# Patient Record
Sex: Female | Born: 1970 | Race: White | Hispanic: No | Marital: Single | Smoking: Current some day smoker
Health system: Southern US, Community
[De-identification: ages and names within clinical notes are randomized; demographics above are authoritative.]

## PROBLEM LIST (undated history)

## (undated) DIAGNOSIS — L409 Psoriasis, unspecified: Secondary | ICD-10-CM

## (undated) DIAGNOSIS — R809 Proteinuria, unspecified: Secondary | ICD-10-CM

## (undated) DIAGNOSIS — C50411 Malignant neoplasm of upper-outer quadrant of right female breast: Secondary | ICD-10-CM

## (undated) DIAGNOSIS — J45909 Unspecified asthma, uncomplicated: Secondary | ICD-10-CM

## (undated) DIAGNOSIS — E1129 Type 2 diabetes mellitus with other diabetic kidney complication: Secondary | ICD-10-CM

## (undated) DIAGNOSIS — C50919 Malignant neoplasm of unspecified site of unspecified female breast: Secondary | ICD-10-CM

## (undated) DIAGNOSIS — E039 Hypothyroidism, unspecified: Secondary | ICD-10-CM

## (undated) DIAGNOSIS — R011 Cardiac murmur, unspecified: Secondary | ICD-10-CM

## (undated) DIAGNOSIS — Z170 Estrogen receptor positive status [ER+]: Secondary | ICD-10-CM

## (undated) DIAGNOSIS — E781 Pure hyperglyceridemia: Secondary | ICD-10-CM

## (undated) DIAGNOSIS — M1990 Unspecified osteoarthritis, unspecified site: Secondary | ICD-10-CM

## (undated) DIAGNOSIS — I10 Essential (primary) hypertension: Secondary | ICD-10-CM

## (undated) HISTORY — DX: Malignant neoplasm of upper-outer quadrant of right female breast: C50.411

## (undated) HISTORY — PX: BREAST BIOPSY: SHX20

## (undated) HISTORY — DX: Essential (primary) hypertension: I10

## (undated) HISTORY — DX: Estrogen receptor positive status (ER+): Z17.0

## (undated) HISTORY — DX: Proteinuria, unspecified: R80.9

## (undated) HISTORY — DX: Pure hyperglyceridemia: E78.1

## (undated) HISTORY — PX: LAPAROSCOPIC CHOLECYSTECTOMY: SUR755

## (undated) HISTORY — PX: OTHER SURGICAL HISTORY: SHX169

## (undated) HISTORY — DX: Type 2 diabetes mellitus with other diabetic kidney complication: E11.29

## (undated) HISTORY — DX: Psoriasis, unspecified: L40.9

## (undated) HISTORY — DX: Hypothyroidism, unspecified: E03.9

## (undated) HISTORY — PX: WISDOM TOOTH EXTRACTION: SHX21

---

## 2008-08-01 ENCOUNTER — Ambulatory Visit: Payer: Self-pay

## 2013-07-31 ENCOUNTER — Ambulatory Visit: Payer: Self-pay | Admitting: Internal Medicine

## 2014-11-26 DIAGNOSIS — E039 Hypothyroidism, unspecified: Secondary | ICD-10-CM | POA: Insufficient documentation

## 2015-03-06 DIAGNOSIS — I10 Essential (primary) hypertension: Secondary | ICD-10-CM | POA: Insufficient documentation

## 2015-07-25 ENCOUNTER — Other Ambulatory Visit: Payer: Self-pay | Admitting: Obstetrics and Gynecology

## 2015-07-25 DIAGNOSIS — E1129 Type 2 diabetes mellitus with other diabetic kidney complication: Secondary | ICD-10-CM | POA: Insufficient documentation

## 2015-07-25 DIAGNOSIS — E781 Pure hyperglyceridemia: Secondary | ICD-10-CM | POA: Insufficient documentation

## 2015-07-25 DIAGNOSIS — Z1231 Encounter for screening mammogram for malignant neoplasm of breast: Secondary | ICD-10-CM

## 2015-08-01 ENCOUNTER — Ambulatory Visit: Payer: Self-pay

## 2015-08-02 ENCOUNTER — Ambulatory Visit
Admission: RE | Admit: 2015-08-02 | Discharge: 2015-08-02 | Disposition: A | Discharge status: Home / Self Care | Payer: BLUE CROSS/BLUE SHIELD | Source: Ambulatory Visit | Attending: Obstetrics and Gynecology | Admitting: Obstetrics and Gynecology

## 2015-08-02 DIAGNOSIS — Z1231 Encounter for screening mammogram for malignant neoplasm of breast: Secondary | ICD-10-CM | POA: Diagnosis not present

## 2015-11-14 DIAGNOSIS — Z6841 Body Mass Index (BMI) 40.0 and over, adult: Secondary | ICD-10-CM | POA: Insufficient documentation

## 2016-07-07 ENCOUNTER — Other Ambulatory Visit: Payer: Self-pay | Admitting: Obstetrics and Gynecology

## 2016-07-07 DIAGNOSIS — Z1231 Encounter for screening mammogram for malignant neoplasm of breast: Secondary | ICD-10-CM

## 2016-08-05 ENCOUNTER — Ambulatory Visit
Admission: RE | Admit: 2016-08-05 | Discharge: 2016-08-05 | Disposition: A | Discharge status: Home / Self Care | Payer: Managed Care, Other (non HMO) | Source: Ambulatory Visit | Attending: Obstetrics and Gynecology | Admitting: Obstetrics and Gynecology

## 2016-08-05 DIAGNOSIS — R928 Other abnormal and inconclusive findings on diagnostic imaging of breast: Secondary | ICD-10-CM | POA: Insufficient documentation

## 2016-08-05 DIAGNOSIS — Z1231 Encounter for screening mammogram for malignant neoplasm of breast: Secondary | ICD-10-CM | POA: Insufficient documentation

## 2016-08-13 ENCOUNTER — Other Ambulatory Visit: Payer: Self-pay | Admitting: Obstetrics and Gynecology

## 2016-08-13 DIAGNOSIS — R921 Mammographic calcification found on diagnostic imaging of breast: Secondary | ICD-10-CM

## 2016-08-13 DIAGNOSIS — R928 Other abnormal and inconclusive findings on diagnostic imaging of breast: Secondary | ICD-10-CM

## 2016-08-21 ENCOUNTER — Ambulatory Visit
Admission: RE | Admit: 2016-08-21 | Discharge: 2016-08-21 | Disposition: A | Discharge status: Home / Self Care | Payer: Managed Care, Other (non HMO) | Source: Ambulatory Visit | Attending: Obstetrics and Gynecology | Admitting: Obstetrics and Gynecology

## 2016-08-21 DIAGNOSIS — R928 Other abnormal and inconclusive findings on diagnostic imaging of breast: Secondary | ICD-10-CM

## 2016-08-21 DIAGNOSIS — R921 Mammographic calcification found on diagnostic imaging of breast: Secondary | ICD-10-CM

## 2016-08-24 ENCOUNTER — Other Ambulatory Visit: Payer: Self-pay | Admitting: Obstetrics and Gynecology

## 2016-08-24 DIAGNOSIS — R928 Other abnormal and inconclusive findings on diagnostic imaging of breast: Secondary | ICD-10-CM

## 2016-08-24 DIAGNOSIS — R921 Mammographic calcification found on diagnostic imaging of breast: Secondary | ICD-10-CM

## 2016-08-25 ENCOUNTER — Other Ambulatory Visit: Payer: BLUE CROSS/BLUE SHIELD

## 2016-08-25 ENCOUNTER — Ambulatory Visit: Payer: BLUE CROSS/BLUE SHIELD

## 2016-09-08 ENCOUNTER — Ambulatory Visit
Admission: RE | Admit: 2016-09-08 | Discharge: 2016-09-08 | Disposition: A | Discharge status: Home / Self Care | Payer: Managed Care, Other (non HMO) | Source: Ambulatory Visit | Attending: Obstetrics and Gynecology | Admitting: Obstetrics and Gynecology

## 2016-09-08 DIAGNOSIS — R921 Mammographic calcification found on diagnostic imaging of breast: Secondary | ICD-10-CM

## 2016-09-08 DIAGNOSIS — R928 Other abnormal and inconclusive findings on diagnostic imaging of breast: Secondary | ICD-10-CM

## 2016-09-08 HISTORY — PX: BREAST BIOPSY: SHX20

## 2016-09-18 ENCOUNTER — Other Ambulatory Visit: Payer: Self-pay | Admitting: Obstetrics and Gynecology

## 2016-09-18 DIAGNOSIS — R921 Mammographic calcification found on diagnostic imaging of breast: Secondary | ICD-10-CM

## 2017-02-26 ENCOUNTER — Ambulatory Visit: Payer: BLUE CROSS/BLUE SHIELD

## 2017-03-03 ENCOUNTER — Ambulatory Visit
Admission: RE | Admit: 2017-03-03 | Discharge: 2017-03-03 | Disposition: A | Discharge status: Home / Self Care | Payer: Managed Care, Other (non HMO) | Source: Ambulatory Visit | Attending: Obstetrics and Gynecology | Admitting: Obstetrics and Gynecology

## 2017-03-03 DIAGNOSIS — R921 Mammographic calcification found on diagnostic imaging of breast: Secondary | ICD-10-CM | POA: Diagnosis present

## 2017-06-02 DIAGNOSIS — R3129 Other microscopic hematuria: Secondary | ICD-10-CM | POA: Insufficient documentation

## 2018-07-25 ENCOUNTER — Other Ambulatory Visit: Payer: Self-pay | Admitting: Obstetrics and Gynecology

## 2018-07-25 DIAGNOSIS — Z1231 Encounter for screening mammogram for malignant neoplasm of breast: Secondary | ICD-10-CM

## 2018-08-19 ENCOUNTER — Ambulatory Visit
Admission: RE | Admit: 2018-08-19 | Discharge: 2018-08-19 | Disposition: A | Discharge status: Home / Self Care | Payer: Managed Care, Other (non HMO) | Source: Ambulatory Visit | Attending: Obstetrics and Gynecology | Admitting: Obstetrics and Gynecology

## 2018-08-19 DIAGNOSIS — Z1231 Encounter for screening mammogram for malignant neoplasm of breast: Secondary | ICD-10-CM | POA: Diagnosis present

## 2019-08-15 ENCOUNTER — Other Ambulatory Visit: Payer: Self-pay | Admitting: Obstetrics and Gynecology

## 2019-08-15 DIAGNOSIS — Z1231 Encounter for screening mammogram for malignant neoplasm of breast: Secondary | ICD-10-CM

## 2019-08-15 HISTORY — PX: BREAST BIOPSY: SHX20

## 2019-08-22 ENCOUNTER — Ambulatory Visit
Admission: RE | Admit: 2019-08-22 | Discharge: 2019-08-22 | Disposition: A | Discharge status: Home / Self Care | Payer: Managed Care, Other (non HMO) | Source: Ambulatory Visit | Attending: Obstetrics and Gynecology | Admitting: Obstetrics and Gynecology

## 2019-08-22 DIAGNOSIS — Z1231 Encounter for screening mammogram for malignant neoplasm of breast: Secondary | ICD-10-CM | POA: Diagnosis present

## 2019-08-24 ENCOUNTER — Other Ambulatory Visit: Payer: Self-pay | Admitting: Obstetrics and Gynecology

## 2019-08-24 DIAGNOSIS — N6310 Unspecified lump in the right breast, unspecified quadrant: Secondary | ICD-10-CM

## 2019-08-24 DIAGNOSIS — R928 Other abnormal and inconclusive findings on diagnostic imaging of breast: Secondary | ICD-10-CM

## 2019-08-24 DIAGNOSIS — R921 Mammographic calcification found on diagnostic imaging of breast: Secondary | ICD-10-CM

## 2019-09-04 ENCOUNTER — Ambulatory Visit
Admission: RE | Admit: 2019-09-04 | Discharge: 2019-09-04 | Disposition: A | Discharge status: Home / Self Care | Payer: Managed Care, Other (non HMO) | Source: Ambulatory Visit | Attending: Obstetrics and Gynecology | Admitting: Obstetrics and Gynecology

## 2019-09-04 DIAGNOSIS — N6310 Unspecified lump in the right breast, unspecified quadrant: Secondary | ICD-10-CM

## 2019-09-04 DIAGNOSIS — R928 Other abnormal and inconclusive findings on diagnostic imaging of breast: Secondary | ICD-10-CM

## 2019-09-04 DIAGNOSIS — R921 Mammographic calcification found on diagnostic imaging of breast: Secondary | ICD-10-CM

## 2019-09-11 ENCOUNTER — Other Ambulatory Visit: Payer: Self-pay | Admitting: Obstetrics and Gynecology

## 2019-09-11 DIAGNOSIS — R928 Other abnormal and inconclusive findings on diagnostic imaging of breast: Secondary | ICD-10-CM

## 2019-09-11 DIAGNOSIS — N6310 Unspecified lump in the right breast, unspecified quadrant: Secondary | ICD-10-CM

## 2019-09-14 ENCOUNTER — Ambulatory Visit
Admission: RE | Admit: 2019-09-14 | Discharge: 2019-09-14 | Disposition: A | Discharge status: Home / Self Care | Payer: Managed Care, Other (non HMO) | Source: Ambulatory Visit | Attending: Obstetrics and Gynecology | Admitting: Obstetrics and Gynecology

## 2019-09-14 ENCOUNTER — Other Ambulatory Visit: Payer: Self-pay

## 2019-09-14 DIAGNOSIS — R928 Other abnormal and inconclusive findings on diagnostic imaging of breast: Secondary | ICD-10-CM | POA: Diagnosis present

## 2019-09-14 DIAGNOSIS — N6310 Unspecified lump in the right breast, unspecified quadrant: Secondary | ICD-10-CM

## 2019-09-18 ENCOUNTER — Other Ambulatory Visit: Payer: Self-pay | Admitting: Anatomic Pathology & Clinical Pathology

## 2019-09-18 ENCOUNTER — Encounter: Payer: Self-pay | Admitting: *Deleted

## 2019-09-18 DIAGNOSIS — C50911 Malignant neoplasm of unspecified site of right female breast: Secondary | ICD-10-CM

## 2019-09-18 DIAGNOSIS — C50912 Malignant neoplasm of unspecified site of left female breast: Secondary | ICD-10-CM

## 2019-09-18 NOTE — Progress Notes (Signed)
Called patient today to establish navigation services.  She is newly diagnosed with invasive mammary carcinoma with lobular features.  States she would like to see Dr. Bary Castilla for surgical consult and Dr. Tish Men for medical oncology consult.  Patient is scheduled to see Dr. Bary Castilla on 09/21/19 at 445.  Will take educational literature and medical oncology appointment to patient tomorrow.  She works at [employer].

## 2019-09-19 ENCOUNTER — Encounter: Payer: Self-pay | Admitting: *Deleted

## 2019-09-19 ENCOUNTER — Other Ambulatory Visit: Payer: Self-pay | Admitting: *Deleted

## 2019-09-19 NOTE — Progress Notes (Signed)
Met patient in person today.  Gave patient breast cancer educational literature, "My Breast Cancer Treatment Handbook" by Josephine Igo, RN.  Informed patient I will be working remotely on Friday, but will call her after her consult.  She is to call with any questions or needs.

## 2019-09-20 ENCOUNTER — Other Ambulatory Visit: Payer: Self-pay | Admitting: General Surgery

## 2019-09-20 DIAGNOSIS — C50911 Malignant neoplasm of unspecified site of right female breast: Secondary | ICD-10-CM

## 2019-09-21 ENCOUNTER — Other Ambulatory Visit: Payer: Self-pay | Admitting: Licensed Clinical Social Worker

## 2019-09-21 ENCOUNTER — Encounter: Payer: Self-pay | Admitting: Licensed Clinical Social Worker

## 2019-09-21 LAB — SURGICAL PATHOLOGY

## 2019-09-22 ENCOUNTER — Inpatient Hospital Stay: Payer: Managed Care, Other (non HMO)

## 2019-09-22 ENCOUNTER — Other Ambulatory Visit: Payer: Self-pay

## 2019-09-22 ENCOUNTER — Encounter: Payer: Self-pay | Admitting: Internal Medicine

## 2019-09-22 ENCOUNTER — Inpatient Hospital Stay: Payer: Managed Care, Other (non HMO) | Attending: Internal Medicine | Admitting: Internal Medicine

## 2019-09-22 DIAGNOSIS — C50411 Malignant neoplasm of upper-outer quadrant of right female breast: Secondary | ICD-10-CM

## 2019-09-22 DIAGNOSIS — Z170 Estrogen receptor positive status [ER+]: Secondary | ICD-10-CM | POA: Insufficient documentation

## 2019-09-22 LAB — CBC WITH DIFFERENTIAL/PLATELET
Abs Immature Granulocytes: 0.03 10*3/uL (ref 0.00–0.07)
Basophils Absolute: 0 10*3/uL (ref 0.0–0.1)
Basophils Relative: 1 %
Eosinophils Absolute: 0.3 10*3/uL (ref 0.0–0.5)
Eosinophils Relative: 3 %
HCT: 43.7 % (ref 36.0–46.0)
Hemoglobin: 14.5 g/dL (ref 12.0–15.0)
Immature Granulocytes: 0 %
Lymphocytes Relative: 24 %
Lymphs Abs: 2.1 10*3/uL (ref 0.7–4.0)
MCH: 31 pg (ref 26.0–34.0)
MCHC: 33.2 g/dL (ref 30.0–36.0)
MCV: 93.4 fL (ref 80.0–100.0)
Monocytes Absolute: 0.6 10*3/uL (ref 0.1–1.0)
Monocytes Relative: 8 %
Neutro Abs: 5.5 10*3/uL (ref 1.7–7.7)
Neutrophils Relative %: 64 %
Platelets: 308 10*3/uL (ref 150–400)
RBC: 4.68 MIL/uL (ref 3.87–5.11)
RDW: 11.6 % (ref 11.5–15.5)
WBC: 8.6 10*3/uL (ref 4.0–10.5)
nRBC: 0 % (ref 0.0–0.2)

## 2019-09-22 LAB — COMPREHENSIVE METABOLIC PANEL
ALT: 38 U/L (ref 0–44)
AST: 37 U/L (ref 15–41)
Albumin: 4.5 g/dL (ref 3.5–5.0)
Alkaline Phosphatase: 88 U/L (ref 38–126)
Anion gap: 11 (ref 5–15)
BUN: 9 mg/dL (ref 6–20)
CO2: 25 mmol/L (ref 22–32)
Calcium: 9.1 mg/dL (ref 8.9–10.3)
Chloride: 100 mmol/L (ref 98–111)
Creatinine, Ser: 0.77 mg/dL (ref 0.44–1.00)
GFR calc Af Amer: >60 mL/min (ref >60)
GFR calc non Af Amer: >60 mL/min (ref >60)
Glucose, Bld: 193 mg/dL — ABNORMAL HIGH (ref 70–99)
Potassium: 3.3 mmol/L — ABNORMAL LOW (ref 3.5–5.1)
Sodium: 136 mmol/L (ref 135–145)
Total Bilirubin: 0.9 mg/dL (ref 0.3–1.2)
Total Protein: 8.3 g/dL — ABNORMAL HIGH (ref 6.5–8.1)

## 2019-09-22 MED ORDER — TAMOXIFEN CITRATE 20 MG PO TABS: 20.0000 mg | ORAL_TABLET | Freq: Every day | ORAL | 1 refills | Status: DC

## 2019-09-22 NOTE — Progress Notes (Signed)
[facility] [place] NOTE  Patient Care Team: Adin Hector, MD as PCP - General (Internal Medicine)  CHIEF COMPLAINTS/PURPOSE OF CONSULTATION: Breast cancer  #  Oncology History Overview Note  # JAN 2021- RIGHT BREAST INVASIVE MAMMARY CARCINOMA WITH LOBULAR FEATURES [s/p Bx; Dr.B]; 4-5 millimeters; grade 1' ER/PR> 90%; HER-2 negative  # DM; Psoriatic arthritis [Enbrel;Dr.[facility]];   # SURVIVORSHIP: pending  DIAGNOSIS: breast cancer  STAGE:         ;  GOALS:  CURRENT/MOST RECENT THERAPY :         Carcinoma of upper-outer quadrant of right breast in female, estrogen receptor positive ([place])  09/22/2019 Initial Diagnosis   Carcinoma of upper-outer quadrant of right breast in female, estrogen receptor positive ([place])      HISTORY OF PRESENTING ILLNESS:  Kaitlyn Potter 49 y.o.  female female with no prior history of breast cancer/or malignancies has been referred to [place] for further evaluation recommendations for new diagnosis of breast cancer.   Patient states she was found to have an abnormal screening mammogram-calcifications and 4 mm nodular lesion at 10 o'clock position which led to diagnostic mammogram/ultrasound/followed by biopsy-as summarized above.  Patient states that she had a previous mammogram in 2018 that showed-calcifications-however these were not biopsied because of technical issues.  The most recent mammogram shows stable calcifications.  Family history of breast cancer:none  Family history of other cancers: mom brother- pancreatic cancer; pat grand father- prostate cancer Menarche: year old Menopause: currently mentruating Used OCP: remote [> 26 y]; IUDs- currently none Used estrogen and progesterone therapy: none History of Radiation to the chest: none Number of pregnancies: Previous biopsy: attempted Biopsy right breast.    Review of Systems  Constitutional: Negative for chills, diaphoresis, fever, malaise/fatigue and weight loss.   HENT: Negative for nosebleeds and sore throat.   Eyes: Negative for double vision.  Respiratory: Negative for cough, hemoptysis, sputum production, shortness of breath and wheezing.   Cardiovascular: Negative for chest pain, palpitations, orthopnea and leg swelling.  Gastrointestinal: Negative for abdominal pain, blood in stool, constipation, diarrhea, heartburn, melena, nausea and vomiting.  Genitourinary: Negative for dysuria, frequency and urgency.  Musculoskeletal: Positive for joint pain. Negative for back pain.  Skin: Negative.  Negative for itching and rash.  Neurological: Negative for dizziness, tingling, focal weakness, weakness and headaches.  Endo/Heme/Allergies: Does not bruise/bleed easily.  Psychiatric/Behavioral: Negative for depression. The patient is not nervous/anxious and does not have insomnia.      MEDICAL HISTORY:  Past Medical History:  Diagnosis Date  . Acquired hypothyroidism   . Carcinoma of upper-outer quadrant of right breast in female, estrogen receptor positive ([place])   . Hypertension   . Hypertriglyceridemia   . Psoriasis   . Type 2 diabetes mellitus with microalbuminuria, without long-term current use of insulin ([place])     SURGICAL HISTORY: Past Surgical History:  Procedure Laterality Date  . BREAST BIOPSY Right 09/08/2016   affrim stereo for calcs attempted but area to close to skin  . LAPAROSCOPIC CHOLECYSTECTOMY    . lasix eye surgery      SOCIAL HISTORY: Social History   Socioeconomic History  . Marital status: Single    Spouse name: Not on file  . Number of children: Not on file  . Years of education: Not on file  . Highest education level: Not on file  Occupational History  . Not on file  Tobacco Use  . Smoking status: Never Smoker  . Smokeless tobacco: Never  Used  Substance and Sexual Activity  . Alcohol use: Yes    Alcohol/week: 1.0 - 2.0 standard drinks    Types: 1 - 2 Glasses of wine per week  . Drug use: Not on file  .  Sexual activity: Not on file  Other Topics Concern  . 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    Social Determinants of Health   Financial Resource Strain:   . Difficulty of Paying Living Expenses: Not on file  Food Insecurity:   . Worried About Charity fundraiser in the Last Year: Not on file  . Ran Out of Food in the Last Year: Not on file  Transportation Needs:   . Lack of Transportation (Medical): Not on file  . Lack of Transportation (Non-Medical): Not on file  Physical Activity:   . Days of Exercise per Week: Not on file  . Minutes of Exercise per Session: Not on file  Stress:   . Feeling of Stress : Not on file  Social Connections:   . Frequency of Communication with Friends and Family: Not on file  . Frequency of Social Gatherings with Friends and Family: Not on file  . Attends Religious Services: Not on file  . Active Member of Clubs or Organizations: Not on file  . Attends Archivist Meetings: Not on file  . Marital Status: Not on file  Intimate Partner Violence:   . Fear of Current or Ex-Partner: Not on file  . Emotionally Abused: Not on file  . Physically Abused: Not on file  . Sexually Abused: Not on file    FAMILY HISTORY: Family History  Problem Relation Age of Onset  . Prostate cancer Paternal Grandfather   . Asthma Father   . Stroke Father   . Hyperlipidemia Father   . Diabetes Mother   . Thyroid disease Mother   . Breast cancer Neg Hx     ALLERGIES:  is allergic to clarithromycin and sulfa antibiotics.  MEDICATIONS:  Current Outpatient Medications  Medication Sig Dispense Refill  . albuterol (VENTOLIN HFA) 108 (90 Base) MCG/ACT inhaler Inhale 2 puffs into the lungs every 6 (six) hours.    . Cholecalciferol 25 MCG (1000 UT) capsule Take 1 capsule by mouth daily.    Marland Kitchen etanercept (ENBREL SURECLICK) 50 MG/ML injection  Inject 1 mL into the skin once a week.    . ferrous sulfate 325 (65 FE) MG tablet Take 1 tablet by mouth daily.    . fluticasone (FLONASE) 50 MCG/ACT nasal spray Place 2 sprays into the nose.    . Glucose Blood (COOL BLOOD GLUCOSE TEST STRIPS VI) Use 3 (three) times daily Use as instructed. Freestyle Lite Test Strips E11.29    . levothyroxine (SYNTHROID) 200 MCG tablet Take 1 tablet by mouth daily.    Marland Kitchen losartan-hydrochlorothiazide (HYZAAR) 50-12.5 MG tablet Take 1 tablet by mouth daily.    . Multiple Vitamin (MULTIVITAMIN) capsule Take 1 capsule by mouth daily.    . phentermine (ADIPEX-P) 37.5 MG tablet Take 1 tablet by mouth every morning.    . Semaglutide, 1 MG/DOSE, (OZEMPIC, 1 MG/DOSE,) 2 MG/1.5ML SOPN Inject 1 mg as directed once a week.    . tamoxifen (NOLVADEX) 20 MG tablet Take 1 tablet (20 mg total) by mouth daily. Start AFTER MRI is done. 60 tablet 1   No current facility-administered medications for this  visit.      Marland Kitchen  PHYSICAL EXAMINATION: ECOG PERFORMANCE STATUS: 0 - Asymptomatic  Vitals:   09/22/19 1130  BP: (!) 128/92  Resp: 20  Temp: (!) 97.4 F (36.3 C)   Filed Weights   09/22/19 1130  Weight: 274 lb (124.3 kg)    Physical Exam  Constitutional: She is oriented to person, place, and time and well-developed, well-nourished, and in no distress.  Accompanied by her brother.  HENT:  Head: Normocephalic and atraumatic.  Mouth/Throat: Oropharynx is clear and moist. No oropharyngeal exudate.  Eyes: Pupils are equal, round, and reactive to light.  Cardiovascular: Normal rate and regular rhythm.  Pulmonary/Chest: No respiratory distress. She has no wheezes.  Abdominal: Soft. Bowel sounds are normal. She exhibits no distension and no mass. There is no abdominal tenderness. There is no rebound and no guarding.  Musculoskeletal:        General: No tenderness or edema. Normal range of motion.     Cervical back: Normal range of motion and neck supple.  Neurological:  She is alert and oriented to person, place, and time.  Skin: Skin is warm.  Psychiatric: Affect normal.   LABORATORY DATA:  I have reviewed the data as listed Lab Results  Component Value Date   WBC 8.6 09/22/2019   HGB 14.5 09/22/2019   HCT 43.7 09/22/2019   MCV 93.4 09/22/2019   PLT 308 09/22/2019   Recent Labs    09/22/19 1220  NA 136  K 3.3*  CL 100  CO2 25  GLUCOSE 193*  BUN 9  CREATININE 0.77  CALCIUM 9.1  GFRNONAA >60  GFRAA >60  PROT 8.3*  ALBUMIN 4.5  AST 37  ALT 38  ALKPHOS 88  BILITOT 0.9    RADIOGRAPHIC STUDIES: I have personally reviewed the radiological images as listed and agreed with the findings in the report. US BREAST LTD UNI RIGHT INC AXILLA  Result Date: 09/04/2019 CLINICAL DATA:  Patient returns today to evaluate a possible RIGHT breast mass and RIGHT breast calcifications. EXAM: DIGITAL DIAGNOSTIC RIGHT MAMMOGRAM WITH CAD AND TOMO ULTRASOUND RIGHT BREAST COMPARISON:  Previous exam(s). ACR Breast Density Category b: There are scattered areas of fibroglandular density. FINDINGS: A mass with associated architectural distortion and microcalcifications is confirmed within the upper-outer quadrant of the RIGHT breast, the masslike component measuring approximately 4 mm greatest dimension. The additional calcifications identified within the inner RIGHT breast were shown to be skin calcifications on diagnostic tangential view of 03/03/2017. Mammographic images were processed with CAD. Targeted ultrasound is performed, showing an irregular hypoechoic mass in the RIGHT breast at the 10 o'clock axis, 7 cm from the nipple, measuring 4 mm, with associated posterior acoustic shadowing, corresponding to the mammographic finding. No enlarged or morphologically abnormal lymph nodes are identified within the RIGHT axilla. IMPRESSION: Suspicious mass within the RIGHT breast at the 10 o'clock axis, 7 cm from the nipple, measuring 4 mm, with associated architectural  distortion on mammogram. Ultrasound-guided biopsy is recommended. RECOMMENDATION: Ultrasound-guided biopsy for the suspicious mass within the RIGHT breast at the 10 o'clock axis, 7 cm from the nipple, measuring 4 mm, with associated architectural distortion seen on mammogram. Ordering physician will be contacted and ultrasound-guided biopsy will be scheduled at patient's earliest convenience. I have discussed the findings and recommendations with the patient. If applicable, a reminder letter will be sent to the patient regarding the next appointment. BI-RADS CATEGORY  4: Suspicious. Electronically Signed   By: Franki Cabot M.D.   On:  09/04/2019 16:04   MM DIAG BREAST TOMO UNI RIGHT  Result Date: 09/04/2019 CLINICAL DATA:  Patient returns today to evaluate a possible RIGHT breast mass and RIGHT breast calcifications. EXAM: DIGITAL DIAGNOSTIC RIGHT MAMMOGRAM WITH CAD AND TOMO ULTRASOUND RIGHT BREAST COMPARISON:  Previous exam(s). ACR Breast Density Category b: There are scattered areas of fibroglandular density. FINDINGS: A mass with associated architectural distortion and microcalcifications is confirmed within the upper-outer quadrant of the RIGHT breast, the masslike component measuring approximately 4 mm greatest dimension. The additional calcifications identified within the inner RIGHT breast were shown to be skin calcifications on diagnostic tangential view of 03/03/2017. Mammographic images were processed with CAD. Targeted ultrasound is performed, showing an irregular hypoechoic mass in the RIGHT breast at the 10 o'clock axis, 7 cm from the nipple, measuring 4 mm, with associated posterior acoustic shadowing, corresponding to the mammographic finding. No enlarged or morphologically abnormal lymph nodes are identified within the RIGHT axilla. IMPRESSION: Suspicious mass within the RIGHT breast at the 10 o'clock axis, 7 cm from the nipple, measuring 4 mm, with associated architectural distortion on  mammogram. Ultrasound-guided biopsy is recommended. RECOMMENDATION: Ultrasound-guided biopsy for the suspicious mass within the RIGHT breast at the 10 o'clock axis, 7 cm from the nipple, measuring 4 mm, with associated architectural distortion seen on mammogram. Ordering physician will be contacted and ultrasound-guided biopsy will be scheduled at patient's earliest convenience. I have discussed the findings and recommendations with the patient. If applicable, a reminder letter will be sent to the patient regarding the next appointment. BI-RADS CATEGORY  4: Suspicious. Electronically Signed   By: Franki Cabot M.D.   On: 09/04/2019 16:04   MM CLIP PLACEMENT RIGHT  Result Date: 09/14/2019 CLINICAL DATA:  Status post ultrasound-guided biopsy right breast mass. EXAM: DIAGNOSTIC RIGHT MAMMOGRAM POST ULTRASOUND BIOPSY COMPARISON:  Previous exam(s). FINDINGS: Mammographic images were obtained following ultrasound guided biopsy of right breast mass 10 o'clock position. The biopsy marking clip is in expected position at the site of biopsy. IMPRESSION: Appropriate positioning of the ribbon shaped biopsy marking clip at the site of biopsy in the right breast mass 10 o'clock position. Final Assessment: Post Procedure Mammograms for Marker Placement Electronically Signed   By: Lovey Newcomer M.D.   On: 09/14/2019 08:40   [place] RT BREAST BX W LOC DEV 1ST LESION IMG BX SPEC US GUIDE  Addendum Date: 09/19/2019   ADDENDUM REPORT: 09/18/2019 13:54 ADDENDUM: PATHOLOGY revealed: A. BREAST, RIGHT AT 10:00, 7 CM FROM THE NIPPLE; ULTRASOUND-GUIDED CORE BIOPSY: - INVASIVE MAMMARY CARCINOMA, WITH LOBULAR FEATURES. - ADJACENT CARCINOMA IN SITU, FAVOR PLEOMORPHIC LOBULAR CARCINOMA IN SITU. 5 mm in this sample. Grade 1. Ductal carcinoma in situ: Not identified. Lymphovascular invasion: Not identified. Pathology results are CONCORDANT with imaging findings, per Dr. Lovey Newcomer. I telephoned patient on 09/18/2019 and discussed biopsy results and  recommendations stated below. All questions were answered. Patient denies significant pain or bleeding from the biopsy site. Biopsy site care instructions were reviewed and patient was instructed to call [facility] with any concerns or questions related to the biopsy. Recommendation: Surgical referral and bilateral breast MRI given lobular histology. Request for surgical referral was relayed to nurse navigators at [facility] by Electa Sniff RN on 09/18/2019. Addendum by Electa Sniff RN on 09/18/2019. Electronically Signed   By: Lovey Newcomer M.D.   On: 09/18/2019 13:54   Addendum Date: 09/18/2019   ADDENDUM REPORT: 09/18/2019 13:54 ADDENDUM: PATHOLOGY revealed: A. BREAST, RIGHT AT 10:00, 7  CM FROM THE NIPPLE; ULTRASOUND-GUIDED CORE BIOPSY: - INVASIVE MAMMARY CARCINOMA, WITH LOBULAR FEATURES. - ADJACENT CARCINOMA IN SITU, FAVOR PLEOMORPHIC LOBULAR CARCINOMA IN SITU. 5 mm in this sample. Grade 1. Ductal carcinoma in situ: Not identified. Lymphovascular invasion: Not identified. Pathology results are CONCORDANT with imaging findings, per Dr. Lovey Newcomer. I telephoned patient on 09/18/2019 and discussed biopsy results and recommendations stated below. All questions were answered. Patient denies significant pain or bleeding from the biopsy site. Biopsy site care instructions were reviewed and patient was instructed to call [facility] with any concerns or questions related to the biopsy. Recommendation: Surgical referral and bilateral breast MRI given lobular histology. Request for surgical referral was relayed to nurse navigators at [facility] by Electa Sniff RN on 09/18/2019. Addendum by Electa Sniff RN on 09/18/2019. Electronically Signed   By: Lovey Newcomer M.D.   On: 09/18/2019 13:54   Result Date: 09/18/2019 CLINICAL DATA:  Patient with indeterminate right breast mass 10 o'clock position. EXAM: ULTRASOUND GUIDED RIGHT BREAST CORE NEEDLE BIOPSY COMPARISON:  Previous  exam(s). FINDINGS: I met with the patient and we discussed the procedure of ultrasound-guided biopsy, including benefits and alternatives. We discussed the high likelihood of a successful procedure. We discussed the risks of the procedure, including infection, bleeding, tissue injury, clip migration, and inadequate sampling. Informed written consent was given. The usual time-out protocol was performed immediately prior to the procedure. Lesion quadrant: Upper outer quadrant Using sterile technique and 1% Lidocaine as local anesthetic, under direct ultrasound visualization, a 14 gauge spring-loaded device was used to perform biopsy of right breast mass 10 o'clock position using a lateral approach. At the conclusion of the procedure ribbon shaped tissue marker clip was deployed into the biopsy cavity. Follow up 2 view mammogram was performed and dictated separately. IMPRESSION: Ultrasound guided biopsy of right breast mass 10 o'clock position. No apparent complications. Electronically Signed: By: Lovey Newcomer M.D. On: 09/14/2019 08:40    ASSESSMENT & PLAN:   Carcinoma of upper-outer quadrant of right breast in female, estrogen receptor positive ([place]) #Invasive mammary carcinoma with lobular features-ER/PR positive HER-2 negative.  Clinically stage I/given the lobular features-MRI of the breast is pending.  #  I had a long discussion with the patient in general regarding the treatment options of breast cancer including-surgery; adjuvant radiation; role of adjuvant systemic therapy including-chemotherapy antihormone therapy. Patient will likely need lumpectomy with sentinel lymph node evaluation; followed by radiation. Decision regarding chemotherapy based on final surgical pathology/gene assay.  Given the available data-it is very unlikely the patient will need chemotherapy-however await imaging/surgery.   # Patient will benefit from antihormone therapy on adjuvant basis.  However given the Covid pandemic/and  the fact that it is quite possible that her definitive surgery might be delayed-I think is reasonable to start the patient on tamoxifen.  Patient will start tamoxifen after her breast MRI is done.  #Discussed the role of anti-hormonal therapy mechanism of action; since patient is premenopausal recommend tamoxifen.  I would recommend tamoxifen for 5 years.  Long discussion regarding the potential adverse events on tamoxifen including but not limited to hot flashes, mood swings, thromboembolic events strokes and also small risk of uterine cancers.   Thank you Dr.Byrnett for allowing me to participate in the care of your pleasant patient. Please do not hesitate to contact me with questions or concerns in the interim.  Discussed with Dr. Tollie Pizza.  # DISPOSITION: # labs-today- cbc/cmp # Follow up MD- 4 weeks-VIDEO-Dr.B  All questions were answered. The patient/family knows to call the clinic with any problems, questions or concerns.     Cammie Sickle, MD 09/25/2019 8:04 AM

## 2019-09-22 NOTE — Assessment & Plan Note (Addendum)
#  Invasive mammary carcinoma with lobular features-ER/PR positive HER-2 negative.  Clinically stage I/given the lobular features-MRI of the breast is pending.  #  I had a long discussion with the patient in general regarding the treatment options of breast cancer including-surgery; adjuvant radiation; role of adjuvant systemic therapy including-chemotherapy antihormone therapy. Patient will likely need lumpectomy with sentinel lymph node evaluation; followed by radiation. Decision regarding chemotherapy based on final surgical pathology/gene assay.  Given the available data-it is very unlikely the patient will need chemotherapy-however await imaging/surgery.   # Patient will benefit from antihormone therapy on adjuvant basis.  However given the Covid pandemic/and the fact that it is quite possible that her definitive surgery might be delayed-I think is reasonable to start the patient on tamoxifen.  Patient will start tamoxifen after her breast MRI is done.  #Discussed the role of anti-hormonal therapy mechanism of action; since patient is premenopausal recommend tamoxifen.  I would recommend tamoxifen for 5 years.  Long discussion regarding the potential adverse events on tamoxifen including but not limited to hot flashes, mood swings, thromboembolic events strokes and also small risk of uterine cancers.   Thank you Dr.Byrnett for allowing me to participate in the care of your pleasant patient. Please do not hesitate to contact me with questions or concerns in the interim.  Discussed with Dr. Tollie Pizza.  # DISPOSITION: # labs-today- cbc/cmp # Follow up MD- 4 weeks-VIDEO-Dr.B

## 2019-09-25 ENCOUNTER — Encounter: Payer: Self-pay | Admitting: *Deleted

## 2019-09-25 ENCOUNTER — Encounter: Payer: Self-pay | Admitting: Internal Medicine

## 2019-09-25 NOTE — Progress Notes (Signed)
Called patient today to follow up with her since her consult with Dr. Rogue Bussing last week.  States she has started on Tamoxifen and is scheduled for a breast MRI tomorrow.  She is anticipating delay in surgery due to Covid 19 pandemic.  She is to let me know her plan as she finds out.  She is to call with any questions or needs.

## 2019-09-27 ENCOUNTER — Ambulatory Visit
Admission: RE | Admit: 2019-09-27 | Discharge: 2019-09-27 | Disposition: A | Discharge status: Home / Self Care | Payer: Managed Care, Other (non HMO) | Source: Ambulatory Visit | Attending: General Surgery | Admitting: General Surgery

## 2019-09-27 ENCOUNTER — Other Ambulatory Visit: Payer: Self-pay

## 2019-09-27 DIAGNOSIS — C50911 Malignant neoplasm of unspecified site of right female breast: Secondary | ICD-10-CM | POA: Diagnosis present

## 2019-09-27 MED ORDER — GADOBUTROL 1 MMOL/ML IV SOLN
10.0000 mL | Freq: Once | INTRAVENOUS | Status: AC | PRN
  Administered 2019-09-27: 10 mL via INTRAVENOUS

## 2019-09-28 ENCOUNTER — Other Ambulatory Visit: Payer: Self-pay | Admitting: General Surgery

## 2019-09-28 DIAGNOSIS — C50911 Malignant neoplasm of unspecified site of right female breast: Secondary | ICD-10-CM

## 2019-10-13 ENCOUNTER — Other Ambulatory Visit (HOSPITAL_COMMUNITY): Payer: Self-pay | Admitting: Diagnostic Radiology

## 2019-10-13 ENCOUNTER — Other Ambulatory Visit: Payer: Self-pay

## 2019-10-13 ENCOUNTER — Ambulatory Visit
Admission: RE | Admit: 2019-10-13 | Discharge: 2019-10-13 | Disposition: A | Discharge status: Home / Self Care | Payer: Managed Care, Other (non HMO) | Source: Ambulatory Visit | Attending: General Surgery | Admitting: General Surgery

## 2019-10-13 DIAGNOSIS — C50911 Malignant neoplasm of unspecified site of right female breast: Secondary | ICD-10-CM

## 2019-10-13 MED ORDER — GADOBUTROL 1 MMOL/ML IV SOLN
10.0000 mL | Freq: Once | INTRAVENOUS | Status: AC | PRN
  Administered 2019-10-13: 10 mL via INTRAVENOUS

## 2019-10-16 DIAGNOSIS — Z923 Personal history of irradiation: Secondary | ICD-10-CM

## 2019-10-16 HISTORY — DX: Personal history of irradiation: Z92.3

## 2019-10-17 ENCOUNTER — Telehealth: Payer: Self-pay | Admitting: *Deleted

## 2019-10-17 NOTE — Telephone Encounter (Signed)
Patient informed not to start Tamoxifen and to keep appointment 2/5

## 2019-10-17 NOTE — Telephone Encounter (Signed)
Per Dr. Jacinto Reap -Hold off on the Tamoxifen - He will discuss the plan of care at the apt on 10/20/19. Please inform patient.

## 2019-10-17 NOTE — Telephone Encounter (Addendum)
Patient called asking if her appointment should be pushed out due to the fact that she has not yet started taking the Tamoxifen which she was to have started taking on 1/14. When she had her MRI they found another mass and she had to have it biopsied as well, it did come back benign and she states she will start her Tamoxifen tomorrow. Her follow up appointment is 2/5, Please advise if this should be pushed out

## 2019-10-19 ENCOUNTER — Other Ambulatory Visit: Payer: Self-pay | Admitting: General Surgery

## 2019-10-19 DIAGNOSIS — C50911 Malignant neoplasm of unspecified site of right female breast: Secondary | ICD-10-CM

## 2019-10-20 ENCOUNTER — Inpatient Hospital Stay: Payer: Managed Care, Other (non HMO) | Attending: Internal Medicine | Admitting: Internal Medicine

## 2019-10-20 ENCOUNTER — Other Ambulatory Visit: Payer: Self-pay | Admitting: General Surgery

## 2019-10-20 DIAGNOSIS — C50411 Malignant neoplasm of upper-outer quadrant of right female breast: Secondary | ICD-10-CM | POA: Diagnosis not present

## 2019-10-20 DIAGNOSIS — Z170 Estrogen receptor positive status [ER+]: Secondary | ICD-10-CM | POA: Diagnosis not present

## 2019-10-20 NOTE — Progress Notes (Signed)
I connected with Kaitlyn Potter on 10/20/19 at  1:00 PM EST by video enabled telemedicine visit and verified that I am speaking with the correct person using two identifiers.  I discussed the limitations, risks, security and privacy concerns of performing an evaluation and management service by telemedicine and the availability of in-person appointments. I also discussed with the patient that there may be a patient responsible charge related to this service. The patient expressed understanding and agreed to proceed.    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  female with history of recently diagnosed clinical stage I breast cancer ER/PR positive HER-2 negative-is here for follow-up.  In the interim patient had a breast MRI-that showed another lesion concerning for malignancy.  Patient underwent subsequent biopsy that was negative.  Patient has also been evaluated by surgery-and she is awaiting planned lumpectomy with sentinel lymph node evaluation in a week from now.  Given the MRI/biopsy-patient did not start on tamoxifen as previously recommended.   Observation/objective: Biopsy findings reviewed  Assessment and  plan: Carcinoma of upper-outer quadrant of right breast in female, estrogen receptor positive ([place]) #Invasive mammary carcinoma with lobular features-ER/PR positive HER-2 negative.  Clinically stage I.  MRI does not show any extensive disease.  Biopsy of a separate lesion-noted to be benign.  #Recommend proceeding with surgery as planned on February 12.  Will recommend holding off tamoxifen at this time.  Discussed that we will assess the type of adjuvant therapy needed post surgery-based on final pathology; genomic assay like Oncotype/lymph node status etc.  #Discussed sequence of adjuvant therapy-chemotherapy first if needed; followed by radiation; followed by antihormone therapy.  As discussed above specifics of adjuvant therapy based on final pathology.   #Discussed at length with the patient the treatment plan she is in agreement.   # DISPOSITION: # follow up in week of 22nd Feb-MD-no labs- Dr.B   Follow-up instructions:  I discussed the assessment and treatment plan with the patient.  The patient was provided an opportunity to ask questions and all were answered.  The patient agreed with the plan and demonstrated understanding of instructions.  The patient was advised to call back or seek an in person evaluation if the symptoms worsen or if the condition fails to improve as anticipated.  Dr. Charlaine Dalton [place] at [facility] 10/20/2019 4:43 PM

## 2019-10-20 NOTE — Assessment & Plan Note (Addendum)
#  Invasive mammary carcinoma with lobular features-ER/PR positive HER-2 negative.  Clinically stage I.  MRI does not show any extensive disease.  Biopsy of a separate lesion-noted to be benign.  #Recommend proceeding with surgery as planned on February 12.  Will recommend holding off tamoxifen at this time.  Discussed that we will assess the type of adjuvant therapy needed post surgery-based on final pathology; genomic assay like Oncotype/lymph node status etc.  #Discussed sequence of adjuvant therapy-chemotherapy first if needed; followed by radiation; followed by antihormone therapy.  As discussed above specifics of adjuvant therapy based on final pathology.   #Discussed at length with the patient the treatment plan she is in agreement.   # DISPOSITION: # follow up in week of 22nd Feb-MD-no labs- Dr.B

## 2019-10-24 ENCOUNTER — Encounter
Admission: RE | Admit: 2019-10-24 | Discharge: 2019-10-24 | Disposition: A | Discharge status: Home / Self Care | Payer: Managed Care, Other (non HMO) | Source: Ambulatory Visit | Attending: General Surgery | Admitting: General Surgery

## 2019-10-24 ENCOUNTER — Other Ambulatory Visit: Payer: Self-pay

## 2019-10-24 DIAGNOSIS — I498 Other specified cardiac arrhythmias: Secondary | ICD-10-CM | POA: Insufficient documentation

## 2019-10-24 DIAGNOSIS — Z20822 Contact with and (suspected) exposure to covid-19: Secondary | ICD-10-CM | POA: Diagnosis not present

## 2019-10-24 DIAGNOSIS — Z01818 Encounter for other preprocedural examination: Secondary | ICD-10-CM | POA: Diagnosis present

## 2019-10-24 DIAGNOSIS — I10 Essential (primary) hypertension: Secondary | ICD-10-CM | POA: Diagnosis not present

## 2019-10-24 HISTORY — DX: Unspecified osteoarthritis, unspecified site: M19.90

## 2019-10-24 HISTORY — DX: Cardiac murmur, unspecified: R01.1

## 2019-10-24 HISTORY — DX: Unspecified asthma, uncomplicated: J45.909

## 2019-10-24 NOTE — Patient Instructions (Signed)
Your procedure is scheduled on: Friday 2/12 Report to [facility] at 8:15  Remember: Instructions that are not followed completely may result in serious medical risk,  up to and including death, or upon the discretion of your surgeon and anesthesiologist your  surgery may need to be rescheduled.     _X__ 1. Do not eat food after midnight the night before your procedure.                 No gum chewing or hard candies. You may drink clear liquids up to 2 hours                 before you are scheduled to arrive for your surgery- DO not drink clear                 liquids within 2 hours of the start of your surgery.                 Clear Liquids include:  water, gatorade, Black Coffee or Tea (Do not add                 anything to coffee or tea).  __X__2.  On the morning of surgery brush your teeth with toothpaste and water, you                may rinse your mouth with mouthwash if you wish.  Do not swallow any toothpaste of mouthwash.     _X__ 3.  No Alcohol for 24 hours before or after surgery.   ___ 4.  Do Not Smoke or use e-cigarettes For 24 Hours Prior to Your Surgery.                 Do not use any chewable tobacco products for at least 6 hours prior to                 surgery.  ____  5.  Bring all medications with you on the day of surgery if instructed.   __x__  6.  Notify your doctor if there is any change in your medical condition      (cold, fever, infections).     Do not wear jewelry, make-up, hairpins, clips or nail polish. Do not wear lotions, powders, or perfumes. You may wear deodorant. Do not shave 48 hours prior to surgery. Men may shave face and neck. Do not bring valuables to the hospital.    [facility] is not responsible for any belongings or valuables.  Contacts, dentures or bridgework may not be worn into surgery. Leave your suitcase in the car. After surgery it may be brought to your room. For patients admitted to the  hospital, discharge time is determined by your treatment team.   Patients discharged the day of surgery will not be allowed to drive home.   Please read over the following fact sheets that you were given:    __x__ Take these medicines the morning of surgery with A SIP OF WATER:    1. cetirizine (ZYRTEC) 10 MG tablet  2. fluticasone (FLONASE) 50 MCG/ACT nasal spray  3. levothyroxine (SYNTHROID) 200 MCG tablet  4.  5.  6.  ____ Fleet Enema (as directed)   _x__ Use CHG Soap as directed  __x__ Use inhalers on the day of surgery albuterol (VENTOLIN HFA) 108 (90 Base) MCG/ACT inhaler and bring it with you  ____ Stop metformin 2 days prior to surgery    ____ Take  1/2 of usual insulin dose the night before surgery. No insulin the morning          of surgery.   ____ Stop Coumadin/Plavix/aspirin on   __x__ Stop Anti-inflammatories No ibuprofen aleve or aspirin until after surgery.  May take tylenol   ____ Stop supplements until after surgery.    ____ Bring C-Pap to the hospital.    1 part rubbing alcohol and 2 parts small ziplock and freeze.  Wrap in cloth and apply to area Small pillow for under arm Increase colace as needed or use Senokot S

## 2019-10-24 NOTE — Pre-Procedure Instructions (Signed)
Counseled to increase high potassium foods between now and the surgery.

## 2019-10-25 ENCOUNTER — Encounter
Admission: RE | Admit: 2019-10-25 | Discharge: 2019-10-25 | Disposition: A | Discharge status: Home / Self Care | Payer: Managed Care, Other (non HMO) | Source: Ambulatory Visit | Attending: General Surgery | Admitting: General Surgery

## 2019-10-25 ENCOUNTER — Other Ambulatory Visit: Payer: Managed Care, Other (non HMO)

## 2019-10-25 DIAGNOSIS — Z01818 Encounter for other preprocedural examination: Secondary | ICD-10-CM | POA: Diagnosis not present

## 2019-10-25 LAB — SARS CORONAVIRUS 2 (TAT 6-24 HRS): SARS Coronavirus 2: NEGATIVE

## 2019-10-27 ENCOUNTER — Ambulatory Visit: Payer: Managed Care, Other (non HMO) | Admitting: Anesthesiology

## 2019-10-27 ENCOUNTER — Ambulatory Visit
Admission: RE | Admit: 2019-10-27 | Discharge: 2019-10-27 | Disposition: A | Discharge status: Home / Self Care | Payer: Managed Care, Other (non HMO) | Attending: General Surgery | Admitting: General Surgery

## 2019-10-27 ENCOUNTER — Ambulatory Visit
Admission: RE | Admit: 2019-10-27 | Discharge: 2019-10-27 | Disposition: A | Discharge status: Home / Self Care | Payer: Managed Care, Other (non HMO) | Source: Ambulatory Visit | Attending: General Surgery | Admitting: General Surgery

## 2019-10-27 ENCOUNTER — Other Ambulatory Visit: Payer: Self-pay | Admitting: General Surgery

## 2019-10-27 ENCOUNTER — Encounter: Payer: Self-pay | Admitting: General Surgery

## 2019-10-27 ENCOUNTER — Encounter
Admission: RE | Disposition: A | Discharge status: Home / Self Care | Payer: Self-pay | Source: Home / Self Care | Attending: General Surgery

## 2019-10-27 ENCOUNTER — Encounter (HOSPITAL_BASED_OUTPATIENT_CLINIC_OR_DEPARTMENT_OTHER)
Admission: RE | Admit: 2019-10-27 | Discharge: 2019-10-27 | Disposition: A | Discharge status: Home / Self Care | Payer: Managed Care, Other (non HMO) | Source: Ambulatory Visit | Attending: General Surgery | Admitting: General Surgery

## 2019-10-27 ENCOUNTER — Other Ambulatory Visit: Payer: Self-pay

## 2019-10-27 DIAGNOSIS — Z79899 Other long term (current) drug therapy: Secondary | ICD-10-CM | POA: Diagnosis not present

## 2019-10-27 DIAGNOSIS — R011 Cardiac murmur, unspecified: Secondary | ICD-10-CM | POA: Diagnosis not present

## 2019-10-27 DIAGNOSIS — F17200 Nicotine dependence, unspecified, uncomplicated: Secondary | ICD-10-CM | POA: Insufficient documentation

## 2019-10-27 DIAGNOSIS — E119 Type 2 diabetes mellitus without complications: Secondary | ICD-10-CM | POA: Diagnosis not present

## 2019-10-27 DIAGNOSIS — C50911 Malignant neoplasm of unspecified site of right female breast: Secondary | ICD-10-CM

## 2019-10-27 DIAGNOSIS — Z8349 Family history of other endocrine, nutritional and metabolic diseases: Secondary | ICD-10-CM | POA: Insufficient documentation

## 2019-10-27 DIAGNOSIS — R809 Proteinuria, unspecified: Secondary | ICD-10-CM | POA: Diagnosis not present

## 2019-10-27 DIAGNOSIS — Z882 Allergy status to sulfonamides status: Secondary | ICD-10-CM | POA: Insufficient documentation

## 2019-10-27 DIAGNOSIS — I10 Essential (primary) hypertension: Secondary | ICD-10-CM | POA: Insufficient documentation

## 2019-10-27 DIAGNOSIS — Z6841 Body Mass Index (BMI) 40.0 and over, adult: Secondary | ICD-10-CM | POA: Diagnosis not present

## 2019-10-27 DIAGNOSIS — Z881 Allergy status to other antibiotic agents status: Secondary | ICD-10-CM | POA: Diagnosis not present

## 2019-10-27 DIAGNOSIS — Z833 Family history of diabetes mellitus: Secondary | ICD-10-CM | POA: Insufficient documentation

## 2019-10-27 DIAGNOSIS — Z823 Family history of stroke: Secondary | ICD-10-CM | POA: Diagnosis not present

## 2019-10-27 DIAGNOSIS — Z8249 Family history of ischemic heart disease and other diseases of the circulatory system: Secondary | ICD-10-CM | POA: Diagnosis not present

## 2019-10-27 DIAGNOSIS — Z9049 Acquired absence of other specified parts of digestive tract: Secondary | ICD-10-CM | POA: Diagnosis not present

## 2019-10-27 DIAGNOSIS — L4050 Arthropathic psoriasis, unspecified: Secondary | ICD-10-CM | POA: Insufficient documentation

## 2019-10-27 DIAGNOSIS — Z820 Family history of epilepsy and other diseases of the nervous system: Secondary | ICD-10-CM | POA: Insufficient documentation

## 2019-10-27 DIAGNOSIS — E781 Pure hyperglyceridemia: Secondary | ICD-10-CM | POA: Insufficient documentation

## 2019-10-27 DIAGNOSIS — Z8042 Family history of malignant neoplasm of prostate: Secondary | ICD-10-CM | POA: Diagnosis not present

## 2019-10-27 DIAGNOSIS — J45909 Unspecified asthma, uncomplicated: Secondary | ICD-10-CM | POA: Insufficient documentation

## 2019-10-27 DIAGNOSIS — D0501 Lobular carcinoma in situ of right breast: Secondary | ICD-10-CM | POA: Diagnosis not present

## 2019-10-27 DIAGNOSIS — Z825 Family history of asthma and other chronic lower respiratory diseases: Secondary | ICD-10-CM | POA: Diagnosis not present

## 2019-10-27 DIAGNOSIS — Z170 Estrogen receptor positive status [ER+]: Secondary | ICD-10-CM | POA: Insufficient documentation

## 2019-10-27 DIAGNOSIS — E669 Obesity, unspecified: Secondary | ICD-10-CM | POA: Insufficient documentation

## 2019-10-27 DIAGNOSIS — E039 Hypothyroidism, unspecified: Secondary | ICD-10-CM | POA: Diagnosis not present

## 2019-10-27 HISTORY — PX: BREAST LUMPECTOMY: SHX2

## 2019-10-27 HISTORY — PX: BREAST LUMPECTOMY WITH NEEDLE LOCALIZATION: SHX5759

## 2019-10-27 LAB — POCT PREGNANCY, URINE
Preg Test, Ur: NEGATIVE
Preg Test, Ur: NEGATIVE

## 2019-10-27 LAB — GLUCOSE, CAPILLARY
Glucose-Capillary: 150 mg/dL — ABNORMAL HIGH (ref 70–99)
Glucose-Capillary: 164 mg/dL — ABNORMAL HIGH (ref 70–99)

## 2019-10-27 SURGERY — BREAST LUMPECTOMY WITH NEEDLE LOCALIZATION
Anesthesia: General | Site: Breast | Laterality: Right

## 2019-10-27 MED ORDER — PROPOFOL 10 MG/ML IV BOLUS
INTRAVENOUS | Status: AC
  Filled 2019-10-27: qty 20

## 2019-10-27 MED ORDER — GLYCOPYRROLATE 0.2 MG/ML IJ SOLN
INTRAMUSCULAR | Status: AC
  Filled 2019-10-27: qty 1

## 2019-10-27 MED ORDER — LIDOCAINE HCL (CARDIAC) PF 100 MG/5ML IV SOSY
PREFILLED_SYRINGE | INTRAVENOUS | Status: DC | PRN
  Administered 2019-10-27: 100 mg via INTRAVENOUS

## 2019-10-27 MED ORDER — ONDANSETRON HCL 4 MG/2ML IJ SOLN
INTRAMUSCULAR | Status: DC | PRN
  Administered 2019-10-27: 4 mg via INTRAVENOUS

## 2019-10-27 MED ORDER — DEXAMETHASONE SODIUM PHOSPHATE 10 MG/ML IJ SOLN
INTRAMUSCULAR | Status: AC
  Filled 2019-10-27: qty 1

## 2019-10-27 MED ORDER — FAMOTIDINE 20 MG PO TABS
ORAL_TABLET | ORAL | Status: AC
  Administered 2019-10-27: 11:00:00 20 mg via ORAL
  Filled 2019-10-27: qty 1

## 2019-10-27 MED ORDER — KETOROLAC TROMETHAMINE 30 MG/ML IJ SOLN
INTRAMUSCULAR | Status: AC
  Filled 2019-10-27: qty 1

## 2019-10-27 MED ORDER — FENTANYL CITRATE (PF) 100 MCG/2ML IJ SOLN
INTRAMUSCULAR | Status: AC
  Filled 2019-10-27: qty 2

## 2019-10-27 MED ORDER — MIDAZOLAM HCL 2 MG/2ML IJ SOLN
INTRAMUSCULAR | Status: AC
  Filled 2019-10-27: qty 2

## 2019-10-27 MED ORDER — TECHNETIUM TC 99M SULFUR COLLOID FILTERED
0.9000 | Freq: Once | INTRAVENOUS | Status: AC | PRN
  Administered 2019-10-27: 0.9 via INTRADERMAL

## 2019-10-27 MED ORDER — ONDANSETRON HCL 4 MG/2ML IJ SOLN
INTRAMUSCULAR | Status: AC
  Filled 2019-10-27: qty 2

## 2019-10-27 MED ORDER — FENTANYL CITRATE (PF) 100 MCG/2ML IJ SOLN
INTRAMUSCULAR | Status: DC | PRN
  Administered 2019-10-27 (×2): 25 ug via INTRAVENOUS
  Administered 2019-10-27 (×3): 50 ug via INTRAVENOUS

## 2019-10-27 MED ORDER — METHYLENE BLUE 0.5 % INJ SOLN
INTRAVENOUS | Status: DC | PRN
  Administered 2019-10-27: 5 mL via INTRADERMAL

## 2019-10-27 MED ORDER — FAMOTIDINE 20 MG PO TABS: 20.0000 mg | ORAL_TABLET | Freq: Once | ORAL | Status: AC

## 2019-10-27 MED ORDER — KETOROLAC TROMETHAMINE 30 MG/ML IJ SOLN
INTRAMUSCULAR | Status: DC | PRN
  Administered 2019-10-27: 30 mg via INTRAVENOUS

## 2019-10-27 MED ORDER — LIDOCAINE HCL (PF) 2 % IJ SOLN
INTRAMUSCULAR | Status: AC
  Filled 2019-10-27: qty 10

## 2019-10-27 MED ORDER — OXYCODONE HCL 5 MG/5ML PO SOLN: 5.0000 mg | Freq: Once | ORAL | Status: AC | PRN

## 2019-10-27 MED ORDER — ACETAMINOPHEN 10 MG/ML IV SOLN
INTRAVENOUS | Status: DC | PRN
  Administered 2019-10-27: 1000 mg via INTRAVENOUS

## 2019-10-27 MED ORDER — HYDROCODONE-ACETAMINOPHEN 5-325 MG PO TABS: 1.0000 | ORAL_TABLET | ORAL | 0 refills | Status: DC | PRN

## 2019-10-27 MED ORDER — PROMETHAZINE HCL 25 MG/ML IJ SOLN: 6.2500 mg | INTRAMUSCULAR | Status: DC | PRN

## 2019-10-27 MED ORDER — DEXAMETHASONE SODIUM PHOSPHATE 10 MG/ML IJ SOLN
INTRAMUSCULAR | Status: DC | PRN
  Administered 2019-10-27: 10 mg via INTRAVENOUS

## 2019-10-27 MED ORDER — MEPERIDINE HCL 50 MG/ML IJ SOLN: 6.2500 mg | INTRAMUSCULAR | Status: DC | PRN

## 2019-10-27 MED ORDER — ACETAMINOPHEN 10 MG/ML IV SOLN
INTRAVENOUS | Status: AC
  Filled 2019-10-27: qty 100

## 2019-10-27 MED ORDER — OXYCODONE HCL 5 MG PO TABS
ORAL_TABLET | ORAL | Status: AC
  Filled 2019-10-27: qty 1

## 2019-10-27 MED ORDER — BUPIVACAINE-EPINEPHRINE (PF) 0.5% -1:200000 IJ SOLN
INTRAMUSCULAR | Status: DC | PRN
  Administered 2019-10-27: 10 mL
  Administered 2019-10-27: 20 mL

## 2019-10-27 MED ORDER — PROPOFOL 10 MG/ML IV BOLUS
INTRAVENOUS | Status: DC | PRN
  Administered 2019-10-27: 200 mg via INTRAVENOUS

## 2019-10-27 MED ORDER — MIDAZOLAM HCL 2 MG/2ML IJ SOLN
INTRAMUSCULAR | Status: DC | PRN
  Administered 2019-10-27: 2 mg via INTRAVENOUS

## 2019-10-27 MED ORDER — SODIUM CHLORIDE 0.9 % IV SOLN: INTRAVENOUS | Status: DC

## 2019-10-27 MED ORDER — OXYCODONE HCL 5 MG PO TABS
5.0000 mg | ORAL_TABLET | Freq: Once | ORAL | Status: AC | PRN
  Administered 2019-10-27: 5 mg via ORAL

## 2019-10-27 MED ORDER — FENTANYL CITRATE (PF) 100 MCG/2ML IJ SOLN
25.0000 ug | INTRAMUSCULAR | Status: DC | PRN
  Administered 2019-10-27: 14:00:00 25 ug via INTRAVENOUS

## 2019-10-27 SURGICAL SUPPLY — 61 items
BINDER BREAST LRG (GAUZE/BANDAGES/DRESSINGS) IMPLANT
BINDER BREAST MEDIUM (GAUZE/BANDAGES/DRESSINGS) IMPLANT
BINDER BREAST XLRG (GAUZE/BANDAGES/DRESSINGS) IMPLANT
BINDER BREAST XXLRG (GAUZE/BANDAGES/DRESSINGS) ×2 IMPLANT
BLADE BOVIE TIP EXT 4 (BLADE) IMPLANT
BLADE SURG 15 STRL SS SAFETY (BLADE) ×6 IMPLANT
BULB RESERV EVAC DRAIN JP 100C (MISCELLANEOUS) IMPLANT
CANISTER SUCT 1200ML W/VALVE (MISCELLANEOUS) ×3 IMPLANT
CHLORAPREP W/TINT 26 (MISCELLANEOUS) ×3 IMPLANT
CLIP VESOCCLUDE MED 6/CT (CLIP) ×2 IMPLANT
CLOSURE WOUND 1/2 X4 (GAUZE/BANDAGES/DRESSINGS) ×1
CNTNR SPEC 2.5X3XGRAD LEK (MISCELLANEOUS)
CONT SPEC 4OZ STER OR WHT (MISCELLANEOUS)
CONTAINER SPEC 2.5X3XGRAD LEK (MISCELLANEOUS) IMPLANT
COVER PROBE FLX POLY STRL (MISCELLANEOUS) ×3 IMPLANT
COVER WAND RF STERILE (DRAPES) ×3 IMPLANT
DEVICE DUBIN SPECIMEN MAMMOGRA (MISCELLANEOUS) ×3 IMPLANT
DRAIN CHANNEL JP 15F RND 16 (MISCELLANEOUS) IMPLANT
DRAPE LAPAROTOMY TRNSV 106X77 (MISCELLANEOUS) ×3 IMPLANT
DRSG GAUZE FLUFF 36X18 (GAUZE/BANDAGES/DRESSINGS) ×6 IMPLANT
DRSG TELFA 3X8 NADH (GAUZE/BANDAGES/DRESSINGS) ×3 IMPLANT
ELECT CAUTERY BLADE TIP 2.5 (TIP) ×3
ELECT REM PT RETURN 9FT ADLT (ELECTROSURGICAL) ×3
ELECTRODE CAUTERY BLDE TIP 2.5 (TIP) ×1 IMPLANT
ELECTRODE REM PT RTRN 9FT ADLT (ELECTROSURGICAL) ×1 IMPLANT
GAUZE SPONGE 4X4 12PLY STRL (GAUZE/BANDAGES/DRESSINGS) ×3 IMPLANT
GLOVE BIO SURGEON STRL SZ7.5 (GLOVE) ×7 IMPLANT
GLOVE INDICATOR 8.0 STRL GRN (GLOVE) ×3 IMPLANT
GOWN STRL REUS W/ TWL LRG LVL3 (GOWN DISPOSABLE) ×2 IMPLANT
GOWN STRL REUS W/TWL LRG LVL3 (GOWN DISPOSABLE) ×4
KIT MARKER MARGIN INK (KITS) ×3 IMPLANT
KIT TURNOVER KIT A (KITS) ×3 IMPLANT
LABEL OR SOLS (LABEL) ×3 IMPLANT
MARGIN MAP 10MM (MISCELLANEOUS) ×3 IMPLANT
MARKER MARGIN CORRECT CLIP (MARKER) ×1 IMPLANT
NDL HYPO 25X1 1.5 SAFETY (NEEDLE) ×2 IMPLANT
NEEDLE HYPO 22GX1.5 SAFETY (NEEDLE) ×3 IMPLANT
NEEDLE HYPO 25X1 1.5 SAFETY (NEEDLE) ×6 IMPLANT
PACK BASIN MINOR ARMC (MISCELLANEOUS) ×3 IMPLANT
PAD DRESSING TELFA 3X8 NADH (GAUZE/BANDAGES/DRESSINGS) ×1 IMPLANT
RETRACTOR RING XSMALL (MISCELLANEOUS) ×1 IMPLANT
RTRCTR WOUND ALEXIS 13CM XS SH (MISCELLANEOUS) ×3
SHEARS FOC LG CVD HARMONIC 17C (MISCELLANEOUS) IMPLANT
SHEARS HARMONIC 9CM CVD (BLADE) ×1 IMPLANT
SLEVE PROBE SENORX GAMMA FIND (MISCELLANEOUS) ×3 IMPLANT
STRIP CLOSURE SKIN 1/2X4 (GAUZE/BANDAGES/DRESSINGS) ×2 IMPLANT
SUT ETHILON 3-0 FS-10 30 BLK (SUTURE) ×3
SUT SILK 2 0 (SUTURE) ×2
SUT SILK 2-0 18XBRD TIE 12 (SUTURE) ×1 IMPLANT
SUT VIC AB 2-0 CT1 27 (SUTURE) ×6
SUT VIC AB 2-0 CT1 TAPERPNT 27 (SUTURE) ×3 IMPLANT
SUT VIC AB 3-0 SH 27 (SUTURE) ×4
SUT VIC AB 3-0 SH 27X BRD (SUTURE) ×2 IMPLANT
SUT VIC AB 4-0 FS2 27 (SUTURE) ×6 IMPLANT
SUT VICRYL+ 3-0 144IN (SUTURE) ×3 IMPLANT
SUTURE EHLN 3-0 FS-10 30 BLK (SUTURE) ×1 IMPLANT
SWABSTK COMLB BENZOIN TINCTURE (MISCELLANEOUS) ×3 IMPLANT
SYR 10ML LL (SYRINGE) ×3 IMPLANT
SYR BULB IRRIG 60ML STRL (SYRINGE) ×3 IMPLANT
TAPE TRANSPORE STRL 2 31045 (GAUZE/BANDAGES/DRESSINGS) ×3 IMPLANT
WATER STERILE IRR 1000ML POUR (IV SOLUTION) ×3 IMPLANT

## 2019-10-27 NOTE — Progress Notes (Signed)
Heart rate 155 to 120's   New orders at this time from dr penwarden

## 2019-10-27 NOTE — Anesthesia Procedure Notes (Addendum)
Procedure Name: LMA Insertion Date/Time: 10/27/2019 11:18 AM Performed by: Doreen Salvage, CRNA Pre-anesthesia Checklist: Patient identified, Patient being monitored, Timeout performed, Emergency Drugs available and Suction available Patient Re-evaluated:Patient Re-evaluated prior to induction Oxygen Delivery Method: Circle system utilized Preoxygenation: Pre-oxygenation with 100% oxygen Induction Type: IV induction Ventilation: Mask ventilation without difficulty LMA: LMA inserted LMA Size: 4.0 Tube type: Oral Number of attempts: 1 Placement Confirmation: positive ETCO2 and breath sounds checked- equal and bilateral Tube secured with: Tape Dental Injury: Teeth and Oropharynx as per pre-operative assessment

## 2019-10-27 NOTE — Anesthesia Preprocedure Evaluation (Signed)
Anesthesia Evaluation  Patient identified by MRN, date of birth, ID band Patient awake    Reviewed: Allergy & Precautions, NPO status , Patient's Chart, lab work & pertinent test results  History of Anesthesia Complications Negative for: history of anesthetic complications  Airway Mallampati: II  TM Distance: >3 FB Neck ROM: Full    Dental no notable dental hx.    Pulmonary asthma (URI related) , Current Smoker and Patient abstained from smoking.,    breath sounds clear to auscultation- rhonchi (-) wheezing      Cardiovascular hypertension, Pt. on medications (-) CAD, (-) Past MI, (-) Cardiac Stents and (-) CABG  Rhythm:Regular Rate:Normal - Systolic murmurs and - Diastolic murmurs    Neuro/Psych neg Seizures negative neurological ROS  negative psych ROS   GI/Hepatic negative GI ROS, Neg liver ROS,   Endo/Other  diabetesHypothyroidism   Renal/GU negative Renal ROS     Musculoskeletal  (+) Arthritis ,   Abdominal (+) + obese,   Peds  Hematology negative hematology ROS (+)   Anesthesia Other Findings Past Medical History: No date: Acquired hypothyroidism No date: Arthritis     Comment:  psoriatic No date: Asthma No date: Carcinoma of upper-outer quadrant of right breast in female,  estrogen receptor positive ([facility]) No date: Heart murmur No date: Hypertension No date: Hypertriglyceridemia No date: Psoriasis No date: Type 2 diabetes mellitus with microalbuminuria, 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 (H[facility])   Reproductive/Obstetrics                             Anesthesia Physical Anesthesia Plan  ASA: III  Anesthesia Plan: General   Post-op Pain Management:    Induction: Intravenous  PONV Risk Score and Plan: 1 and Ondansetron, Dexamethasone and Midazolam  Airway Management Planned: LMA  Additional Equipment:   Intra-op Plan:   Post-operative Plan:   Informed  Consent: I have reviewed the patients History and Physical, chart, labs and discussed the procedure including the risks, benefits and alternatives for the proposed anesthesia with the patient or authorized representative who has indicated his/her understanding and acceptance.     Dental advisory given  Plan Discussed with: CRNA and Anesthesiologist  Anesthesia Plan Comments:         Anesthesia Quick Evaluation

## 2019-10-27 NOTE — Discharge Instructions (Signed)
Breast Biopsy, Care After These instructions give you information about caring for yourself after your procedure. Your doctor may also give you more specific instructions. Call your doctor if you have any problems or questions after your procedure. What can I expect after the procedure? After your procedure, it is common to have:  Bruising on your breast.  Numbness, tingling, or pain near your biopsy site. Follow these instructions at home: Medicines  Take over-the-counter and prescription medicines only as told by your doctor.  Do not drive for 24 hours if you were given a medicine to help you relax (sedative) during your procedure.  Do not drink alcohol while taking pain medicine.  Do not drive or use heavy machinery while taking prescription pain medicine. Biopsy site care      Follow instructions from your doctor about how to take care of your cut from surgery (incision) or your puncture area. Make sure you: ? Wash your hands with soap and water before you change your bandage (dressing). If you cannot use soap and water, use hand sanitizer. ? Change your bandage as told by your doctor. ? Leave stitches (sutures), skin glue, or skin tape (adhesive strips) in place. They may need to stay in place for 2 weeks or longer. If tape strips get loose and curl up, you may trim the loose edges. Do not remove tape strips completely unless your doctor says it is okay.  If you have stitches, keep them dry when you take a bath or a shower.  Check your cut or puncture area every day for signs of infection. Check for: ? Redness, swelling, or pain. ? Fluid or blood. ? Warmth. ? Pus or a bad smell.  Protect the biopsy area. Do not let the area get bumped. Activity  If you had a cut during your procedure, avoid activities that could pull your cut open. These include: ? Stretching. ? Reaching over your head. ? Exercise. ? Sports. ? Lifting anything that weighs more than 3 lb (1.4  kg).  Return to your normal activities as told by your doctor. Ask your doctor what activities are safe for you. Managing pain, stiffness, and swelling If told, put ice on the biopsy site to relieve swelling:  Put ice in a plastic bag.  Place a towel between your skin and the bag.  Leave the ice on for 20 minutes, 2-3 times a day. General instructions  Continue your normal diet.  Wear a good support bra for as long as told by your doctor.  Get checked for extra fluid around your lymph nodes (lymphedema) as often as told by your doctor.  Keep all follow-up visits as told by your doctor. This is important. Contact a doctor if:  You notice any of the following at the biopsy site: ? More redness, swelling, or pain. ? More fluid or blood coming from the site. ? The site feels warm to the touch. ? Pus or a bad smell coming from the site. ? The site breaks open after the stitches or skin tape strips have been removed.  You have a rash.  You have a fever. Get help right away if:  You have more bleeding from the biopsy site. Get help right away if bleeding is more than a small spot.  You have trouble breathing.  You have red streaks around the biopsy site. Summary  After your procedure, it is common to have bruising, numbness, tingling, or pain near the biopsy site.  Do not drive  or use heavy machinery while taking prescription pain medicine.  Wear a good support bra for as long as told by your doctor.  If you had a cut during your procedure, avoid activities that may pull the cut open. Ask your doctor what activities are safe for you. This information is not intended to replace advice given to you by your health care provider. Make sure you discuss any questions you have with your health care provider. Document Revised: 02/17/2018 Document Reviewed: 02/17/2018 Elsevier Patient Education  [place].

## 2019-10-27 NOTE — Progress Notes (Signed)
Pt emotional  Crying   No pain

## 2019-10-27 NOTE — Transfer of Care (Signed)
Immediate Anesthesia Transfer of Care Note  Patient: Muna Demers  Procedure(s) Performed: Procedure(s) with comments: BREAST LUMPECTOMY WITH NEEDLE LOCALIZATION (Right) - and SLN  Patient Location: PACU  Anesthesia Type:General  Level of Consciousness: sedated  Airway & Oxygen Therapy: Patient Spontanous Breathing and Patient connected to face mask oxygen  Post-op Assessment: Report given to RN and Post -op Vital signs reviewed and stable  Post vital signs: Reviewed and stable  Last Vitals:  Vitals:   10/27/19 1006 10/27/19 1305  BP: (!) 124/92 113/60  Pulse: (!) 125 (!) 107  Resp: 16 (!) 26  Temp: 37.2 C (!) (P) 36.2 C  SpO2: 588% 325%    Complications: No apparent anesthesia complications

## 2019-10-27 NOTE — Anesthesia Postprocedure Evaluation (Signed)
Anesthesia Post Note  Patient: Kaitlyn Potter  Procedure(s) Performed: BREAST LUMPECTOMY WITH NEEDLE LOCALIZATION (Right Breast)  Patient location during evaluation: PACU Anesthesia Type: General Level of consciousness: awake and alert and oriented Pain management: pain level controlled Vital Signs Assessment: post-procedure vital signs reviewed and stable Respiratory status: spontaneous breathing, nonlabored ventilation and respiratory function stable Cardiovascular status: blood pressure returned to baseline and stable Postop Assessment: no signs of nausea or vomiting Anesthetic complications: no     Last Vitals:  Vitals:   10/27/19 1403 10/27/19 1404  BP: 127/80 122/82  Pulse:  (!) 112  Resp:  13  Temp: 36.5 C   SpO2:  95%    Last Pain:  Vitals:   10/27/19 1403  TempSrc:   PainSc: 3                  Dontravious Camille

## 2019-10-27 NOTE — H&P (Signed)
Kaitlyn Potter [id number] 1971/05/10     HPI:  Patient with early breast cancer who has chosen breast conservation.  Tolerated wire localization and SLN injection.   Medications Prior to Admission  Medication Sig Dispense Refill Last Dose  . albuterol (VENTOLIN HFA) 108 (90 Base) MCG/ACT inhaler Inhale 2 puffs into the lungs every 6 (six) hours as needed for wheezing or shortness of breath.    Past Month at Unknown time  . cetirizine (ZYRTEC) 10 MG tablet Take 10 mg by mouth daily.   10/27/2019 at Unknown time  . Cholecalciferol 25 MCG (1000 UT) capsule Take 1,000 Units by mouth daily.    Past Week at Unknown time  . cyanocobalamin (,VITAMIN B-12,) 1000 MCG/ML injection Inject 1,000 mcg into the muscle every 30 (thirty) days.   Past Week at Unknown time  . docusate sodium (COLACE) 100 MG capsule Take 100 mg by mouth daily.   Past Week at Unknown time  . etanercept (ENBREL SURECLICK) 50 MG/ML injection Inject 50 mg into the skin every 14 (fourteen) days.    Past Month at Unknown time  . ferrous sulfate 325 (65 FE) MG tablet Take 325 mg by mouth daily.    Past Week at Unknown time  . fluticasone (FLONASE) 50 MCG/ACT nasal spray Place 2 sprays into both nostrils daily as needed for allergies.    Past Month at Unknown time  . levothyroxine (SYNTHROID) 200 MCG tablet Take 200 mcg by mouth daily before breakfast.    10/27/2019 at Unknown time  . losartan-hydrochlorothiazide (HYZAAR) 50-12.5 MG tablet Take 1 tablet by mouth daily.   10/26/2019 at Unknown time  . Multiple Vitamin (MULTIVITAMIN) capsule Take 1 capsule by mouth daily.   10/26/2019 at Unknown time  . phentermine (ADIPEX-P) 37.5 MG tablet Take 37.5 mg by mouth daily before breakfast.    Past Week at Unknown time  . Semaglutide, 1 MG/DOSE, (OZEMPIC, 1 MG/DOSE,) 2 MG/1.5ML SOPN Inject 1 mg as directed once a week.   Past Week at Unknown time  . Glucose Blood (COOL BLOOD GLUCOSE TEST STRIPS VI) Use 3 (three) times daily Use as instructed.  Freestyle Lite Test Strips E11.29     . tamoxifen (NOLVADEX) 20 MG tablet Take 1 tablet (20 mg total) by mouth daily. Start AFTER MRI is done. (Patient not taking: Reported on 10/24/2019) 60 tablet 1    Allergies  Allergen Reactions  . Sulfa Antibiotics Itching  . Clarithromycin Other (See Comments)    Metal taste in mouth.   Past Medical History:  Diagnosis Date  . Acquired hypothyroidism   . Arthritis    psoriatic  . Asthma   . Carcinoma of upper-outer quadrant of right breast in female, estrogen receptor positive ([place])   . Heart murmur   . Hypertension   . Hypertriglyceridemia   . Psoriasis   . Type 2 diabetes mellitus with microalbuminuria, without long-term current use of insulin [facility])    Past Surgical History:  Procedure Laterality Date  . BREAST BIOPSY Right 09/08/2016   affrim stereo for calcs attempted but area to close to skin  . BREAST BIOPSY Right   . LAPAROSCOPIC CHOLECYSTECTOMY    . lasix eye surgery    . WISDOM TOOTH EXTRACTION     Social History   Socioeconomic History  . Marital status: Single    Spouse name: Not on file  . Number of children: Not on file  . Years of education: Not on file  . Highest education level: Not  on file  Occupational History  . Not on file  Tobacco Use  . Smoking status: Current Some Day Smoker  . Smokeless tobacco: Never Used  . Tobacco comment: rarely  Substance and Sexual Activity  . Alcohol use: Yes    Alcohol/week: 1.0 - 2.0 standard drinks    Types: 1 - 2 Glasses of wine per week    Comment: rarely  . Drug use: Never  . Sexual activity: Not on file  Other Topics Concern  . 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    Social Determinants of Health   Financial Resource Strain:   . Difficulty of Paying Living Expenses: Not on file  Food Insecurity:   . Worried About Charity fundraiser in the  Last Year: Not on file  . Ran Out of Food in the Last Year: Not on file  Transportation Needs:   . Lack of Transportation (Medical): Not on file  . Lack of Transportation (Non-Medical): Not on file  Physical Activity:   . Days of Exercise per Week: Not on file  . Minutes of Exercise per Session: Not on file  Stress:   . Feeling of Stress : Not on file  Social Connections:   . Frequency of Communication with Friends and Family: Not on file  . Frequency of Social Gatherings with Friends and Family: Not on file  . Attends Religious Services: Not on file  . Active Member of Clubs or Organizations: Not on file  . Attends Archivist Meetings: Not on file  . Marital Status: Not on file  Intimate Partner Violence:   . Fear of Current or Ex-Partner: Not on file  . Emotionally Abused: Not on file  . Physically Abused: Not on file  . Sexually Abused: Not on file   Social History   Social History Narrative   Lives in [place] with parents; no children; never married; work in [place] [employer] [facility]/ PT dept; no alcohol; no smoking.      ROS: Negative.     PE: HEENT: Negative. Lungs: Clear. Cardio: RR.  Assessment/Plan:  Proceed with planned right breast wide excision and SLN biopsy.    Forest Gleason [facility] 10/27/2019

## 2019-10-27 NOTE — Op Note (Signed)
Preoperative diagnosis: Right breast cancer.  Postoperative diagnosis: Same.  Operative procedure: Right breast wide excision with wire localization and sentinel node biopsy.  Operating surgeon: Hervey Ard, MD.  Anesthesia: General by LMA, Marcaine 0.5% with 1: 200,000 units of epinephrine: 30 cc.  Estimated blood loss: Less than 10 cc.  Clinical note: This 49 year old woman recently had a 4 mm lesion identified on her mammogram and biopsy showed evidence of an invasive mammary cancer.  She is admitted for breast conservation surgery.  She underwent injection with technetium sulfur colloid prior to the procedure as well as wire localization.  The patient was not a candidate for prophylactic antibiotics.  Operative note: The patient underwent general anesthesia without difficulty.  The breast was then carefully prepped with ChloraPrep and draped after instillation of 5 cc of 0.5% methylene blue in the subareolar plexus.  Attempt to identify the localizing wire with ultrasound were unsuccessful.  A curvilinear incision in the upper outer quadrant of the breast was made after local anesthesia had been infiltrated.  The skin was incised sharply and the remaining dissection completed with electrocautery.  The adipose tissue was elevated and the localizing wire brought into the wound.  A block of tissue approximately 3 x 5 x 5 cm was then excised, orientated and sent for specimen radiograph.  This showed the intact wire and clip within the specimen.  Subsequent report by pathology showed the minimal margin at 3 mm, anteriorly.  While the breast specimen was being processed attention was turned to the axilla.  There were low counts on initial scanning of about 150 in a broad area throughout the lower aspect of the axilla.  This was completed through the wide excision site.  A blue lymphatic was tracked down into the lower level of the axilla but then could not be identified in deeper sections.  A 3 x  4 x 3 section of tissue with counts in the 150 was 6 resected which include at least 1 visible lymph node.  After this was removed no particular high count areas were noted.  At the 8 cm deep level a single hot, blue lymph node was identified about 10 mm in diameter.  This was sent separately as sentinel node #1.  The lower axillary fat pad was sent as a separate specimen.  Good hemostasis was noted.  Clips were placed into the medial, lateral, deep and superficial aspects of the wide excision site for postoperative RT.  The deep tissue was approximated with interrupted 2-0 Vicryl figure-of-eight sutures in 2 layers.  The deep adipose layer was approximated with simple sutures of 2-0 Vicryl.  The skin was closed with a running 4-0 Vicryl subcuticular suture.  Benzoin and Steri-Strips followed by Telfa, fluff gauze and a compressive wrap were applied.  The patient tolerated the procedure well and was taken to the recovery room in stable condition.

## 2019-10-31 ENCOUNTER — Other Ambulatory Visit: Payer: Self-pay | Admitting: Pathology

## 2019-10-31 LAB — SURGICAL PATHOLOGY

## 2019-11-01 ENCOUNTER — Other Ambulatory Visit: Payer: Self-pay | Admitting: General Surgery

## 2019-11-02 ENCOUNTER — Other Ambulatory Visit
Admission: RE | Admit: 2019-11-02 | Discharge: 2019-11-02 | Disposition: A | Discharge status: Home / Self Care | Payer: Managed Care, Other (non HMO) | Source: Ambulatory Visit | Attending: General Surgery | Admitting: General Surgery

## 2019-11-02 ENCOUNTER — Other Ambulatory Visit: Payer: Self-pay

## 2019-11-02 DIAGNOSIS — Z01812 Encounter for preprocedural laboratory examination: Secondary | ICD-10-CM | POA: Diagnosis present

## 2019-11-02 DIAGNOSIS — Z20822 Contact with and (suspected) exposure to covid-19: Secondary | ICD-10-CM | POA: Insufficient documentation

## 2019-11-02 LAB — SARS CORONAVIRUS 2 (TAT 6-24 HRS): SARS Coronavirus 2: NEGATIVE

## 2019-11-06 ENCOUNTER — Ambulatory Visit: Payer: Managed Care, Other (non HMO) | Admitting: Certified Registered"

## 2019-11-06 ENCOUNTER — Encounter: Payer: Self-pay | Admitting: General Surgery

## 2019-11-06 ENCOUNTER — Ambulatory Visit
Admission: RE | Admit: 2019-11-06 | Discharge: 2019-11-06 | Disposition: A | Discharge status: Home / Self Care | Payer: Managed Care, Other (non HMO) | Attending: General Surgery | Admitting: General Surgery

## 2019-11-06 ENCOUNTER — Encounter
Admission: RE | Disposition: A | Discharge status: Home / Self Care | Payer: Self-pay | Source: Home / Self Care | Attending: General Surgery

## 2019-11-06 ENCOUNTER — Inpatient Hospital Stay: Payer: Managed Care, Other (non HMO) | Admitting: Internal Medicine

## 2019-11-06 ENCOUNTER — Other Ambulatory Visit: Payer: Self-pay

## 2019-11-06 DIAGNOSIS — I10 Essential (primary) hypertension: Secondary | ICD-10-CM | POA: Diagnosis not present

## 2019-11-06 DIAGNOSIS — E781 Pure hyperglyceridemia: Secondary | ICD-10-CM | POA: Diagnosis not present

## 2019-11-06 DIAGNOSIS — J45909 Unspecified asthma, uncomplicated: Secondary | ICD-10-CM | POA: Insufficient documentation

## 2019-11-06 DIAGNOSIS — Z882 Allergy status to sulfonamides status: Secondary | ICD-10-CM | POA: Insufficient documentation

## 2019-11-06 DIAGNOSIS — E119 Type 2 diabetes mellitus without complications: Secondary | ICD-10-CM | POA: Diagnosis not present

## 2019-11-06 DIAGNOSIS — Z79899 Other long term (current) drug therapy: Secondary | ICD-10-CM | POA: Diagnosis not present

## 2019-11-06 DIAGNOSIS — Z170 Estrogen receptor positive status [ER+]: Secondary | ICD-10-CM | POA: Diagnosis not present

## 2019-11-06 DIAGNOSIS — Z881 Allergy status to other antibiotic agents status: Secondary | ICD-10-CM | POA: Diagnosis not present

## 2019-11-06 DIAGNOSIS — C50911 Malignant neoplasm of unspecified site of right female breast: Secondary | ICD-10-CM | POA: Diagnosis present

## 2019-11-06 DIAGNOSIS — F17200 Nicotine dependence, unspecified, uncomplicated: Secondary | ICD-10-CM | POA: Diagnosis not present

## 2019-11-06 DIAGNOSIS — R011 Cardiac murmur, unspecified: Secondary | ICD-10-CM | POA: Insufficient documentation

## 2019-11-06 DIAGNOSIS — L4050 Arthropathic psoriasis, unspecified: Secondary | ICD-10-CM | POA: Diagnosis not present

## 2019-11-06 DIAGNOSIS — E039 Hypothyroidism, unspecified: Secondary | ICD-10-CM | POA: Insufficient documentation

## 2019-11-06 DIAGNOSIS — Z6841 Body Mass Index (BMI) 40.0 and over, adult: Secondary | ICD-10-CM | POA: Diagnosis not present

## 2019-11-06 DIAGNOSIS — E669 Obesity, unspecified: Secondary | ICD-10-CM | POA: Diagnosis not present

## 2019-11-06 DIAGNOSIS — Z9049 Acquired absence of other specified parts of digestive tract: Secondary | ICD-10-CM | POA: Insufficient documentation

## 2019-11-06 HISTORY — PX: BREAST LUMPECTOMY: SHX2

## 2019-11-06 HISTORY — PX: RE-EXCISION OF BREAST CANCER,SUPERIOR MARGINS: SHX6047

## 2019-11-06 LAB — GLUCOSE, CAPILLARY
Glucose-Capillary: 174 mg/dL — ABNORMAL HIGH (ref 70–99)
Glucose-Capillary: 185 mg/dL — ABNORMAL HIGH (ref 70–99)

## 2019-11-06 SURGERY — RE-EXCISION OF BREAST CANCER,SUPERIOR MARGINS
Anesthesia: General | Laterality: Right

## 2019-11-06 MED ORDER — ACETAMINOPHEN 10 MG/ML IV SOLN
INTRAVENOUS | Status: DC | PRN
  Administered 2019-11-06: 1000 mg via INTRAVENOUS

## 2019-11-06 MED ORDER — ESMOLOL HCL 100 MG/10ML IV SOLN
INTRAVENOUS | Status: DC | PRN
  Administered 2019-11-06: 10 mg via INTRAVENOUS

## 2019-11-06 MED ORDER — ONDANSETRON HCL 4 MG/2ML IJ SOLN
INTRAMUSCULAR | Status: AC
  Filled 2019-11-06: qty 2

## 2019-11-06 MED ORDER — MEPERIDINE HCL 50 MG/ML IJ SOLN: 6.2500 mg | INTRAMUSCULAR | Status: DC | PRN

## 2019-11-06 MED ORDER — FENTANYL CITRATE (PF) 100 MCG/2ML IJ SOLN: 25.0000 ug | INTRAMUSCULAR | Status: DC | PRN

## 2019-11-06 MED ORDER — PROMETHAZINE HCL 25 MG/ML IJ SOLN: 6.2500 mg | INTRAMUSCULAR | Status: DC | PRN

## 2019-11-06 MED ORDER — CEFAZOLIN SODIUM-DEXTROSE 2-4 GM/100ML-% IV SOLN
2.0000 g | INTRAVENOUS | Status: AC
  Administered 2019-11-06: 12:00:00 2 g via INTRAVENOUS

## 2019-11-06 MED ORDER — BUPIVACAINE-EPINEPHRINE (PF) 0.5% -1:200000 IJ SOLN
INTRAMUSCULAR | Status: DC | PRN
  Administered 2019-11-06: 20 mL

## 2019-11-06 MED ORDER — FAMOTIDINE 20 MG PO TABS
ORAL_TABLET | ORAL | Status: AC
  Administered 2019-11-06: 20 mg via ORAL
  Filled 2019-11-06: qty 1

## 2019-11-06 MED ORDER — SODIUM CHLORIDE 0.9 % IV SOLN: INTRAVENOUS | Status: DC

## 2019-11-06 MED ORDER — OXYCODONE HCL 5 MG PO TABS: 5.0000 mg | ORAL_TABLET | Freq: Once | ORAL | Status: DC | PRN

## 2019-11-06 MED ORDER — MIDAZOLAM HCL 2 MG/2ML IJ SOLN
INTRAMUSCULAR | Status: DC | PRN
  Administered 2019-11-06 (×2): 2 mg via INTRAVENOUS

## 2019-11-06 MED ORDER — DEXAMETHASONE SODIUM PHOSPHATE 10 MG/ML IJ SOLN
INTRAMUSCULAR | Status: DC | PRN
  Administered 2019-11-06: 10 mg via INTRAVENOUS

## 2019-11-06 MED ORDER — OXYCODONE HCL 5 MG/5ML PO SOLN: 5.0000 mg | Freq: Once | ORAL | Status: DC | PRN

## 2019-11-06 MED ORDER — ESMOLOL HCL 100 MG/10ML IV SOLN
INTRAVENOUS | Status: AC
  Filled 2019-11-06: qty 10

## 2019-11-06 MED ORDER — LIDOCAINE HCL (PF) 2 % IJ SOLN
INTRAMUSCULAR | Status: AC
  Filled 2019-11-06: qty 10

## 2019-11-06 MED ORDER — MIDAZOLAM HCL 2 MG/2ML IJ SOLN
INTRAMUSCULAR | Status: AC
  Filled 2019-11-06: qty 2

## 2019-11-06 MED ORDER — FAMOTIDINE 20 MG PO TABS: 20.0000 mg | ORAL_TABLET | Freq: Once | ORAL | Status: AC

## 2019-11-06 MED ORDER — CEFAZOLIN SODIUM-DEXTROSE 2-4 GM/100ML-% IV SOLN
INTRAVENOUS | Status: AC
  Filled 2019-11-06: qty 100

## 2019-11-06 MED ORDER — ALBUTEROL SULFATE HFA 108 (90 BASE) MCG/ACT IN AERS
INHALATION_SPRAY | RESPIRATORY_TRACT | Status: DC | PRN
  Administered 2019-11-06: 4 via RESPIRATORY_TRACT

## 2019-11-06 MED ORDER — ONDANSETRON HCL 4 MG/2ML IJ SOLN
INTRAMUSCULAR | Status: DC | PRN
  Administered 2019-11-06 (×2): 4 mg via INTRAVENOUS

## 2019-11-06 MED ORDER — FENTANYL CITRATE (PF) 100 MCG/2ML IJ SOLN
INTRAMUSCULAR | Status: AC
  Filled 2019-11-06: qty 2

## 2019-11-06 MED ORDER — PROPOFOL 500 MG/50ML IV EMUL
INTRAVENOUS | Status: AC
  Filled 2019-11-06: qty 50

## 2019-11-06 MED ORDER — GLYCOPYRROLATE 0.2 MG/ML IJ SOLN
INTRAMUSCULAR | Status: DC | PRN
  Administered 2019-11-06: .2 mg via INTRAVENOUS

## 2019-11-06 MED ORDER — LIDOCAINE HCL (CARDIAC) PF 100 MG/5ML IV SOSY
PREFILLED_SYRINGE | INTRAVENOUS | Status: DC | PRN
  Administered 2019-11-06: 100 mg via INTRAVENOUS

## 2019-11-06 MED ORDER — FENTANYL CITRATE (PF) 100 MCG/2ML IJ SOLN
INTRAMUSCULAR | Status: DC | PRN
  Administered 2019-11-06 (×2): 25 ug via INTRAVENOUS

## 2019-11-06 MED ORDER — PROPOFOL 10 MG/ML IV BOLUS
INTRAVENOUS | Status: DC | PRN
  Administered 2019-11-06: 200 mg via INTRAVENOUS

## 2019-11-06 MED ORDER — PHENYLEPHRINE HCL (PRESSORS) 10 MG/ML IV SOLN
INTRAVENOUS | Status: DC | PRN
  Administered 2019-11-06: 100 ug via INTRAVENOUS

## 2019-11-06 MED ORDER — LACTATED RINGERS IV SOLN: INTRAVENOUS | Status: DC | PRN

## 2019-11-06 MED ORDER — ACETAMINOPHEN 10 MG/ML IV SOLN
INTRAVENOUS | Status: AC
  Filled 2019-11-06: qty 200

## 2019-11-06 MED ORDER — GLYCOPYRROLATE 0.2 MG/ML IJ SOLN
INTRAMUSCULAR | Status: AC
  Filled 2019-11-06: qty 1

## 2019-11-06 SURGICAL SUPPLY — 45 items
BINDER BREAST LRG (GAUZE/BANDAGES/DRESSINGS) IMPLANT
BINDER BREAST MEDIUM (GAUZE/BANDAGES/DRESSINGS) IMPLANT
BINDER BREAST XLRG (GAUZE/BANDAGES/DRESSINGS) IMPLANT
BINDER BREAST XXLRG (GAUZE/BANDAGES/DRESSINGS) IMPLANT
BLADE BOVIE TIP EXT 4 (BLADE) IMPLANT
BLADE SURG 15 STRL SS SAFETY (BLADE) ×4 IMPLANT
CANISTER SUCT 1200ML W/VALVE (MISCELLANEOUS) ×2 IMPLANT
CHLORAPREP W/TINT 26 (MISCELLANEOUS) ×4 IMPLANT
CNTNR SPEC 2.5X3XGRAD LEK (MISCELLANEOUS) ×1
CONT SPEC 4OZ STER OR WHT (MISCELLANEOUS) ×1
CONTAINER SPEC 2.5X3XGRAD LEK (MISCELLANEOUS) ×1 IMPLANT
COVER PROBE FLX POLY STRL (MISCELLANEOUS) IMPLANT
COVER WAND RF STERILE (DRAPES) ×2 IMPLANT
DEVICE DUBIN SPECIMEN MAMMOGRA (MISCELLANEOUS) IMPLANT
DRAPE LAPAROTOMY 100X77 ABD (DRAPES) ×2 IMPLANT
DRSG GAUZE FLUFF 36X18 (GAUZE/BANDAGES/DRESSINGS) ×2 IMPLANT
DRSG TELFA 4X3 1S NADH ST (GAUZE/BANDAGES/DRESSINGS) ×2 IMPLANT
ELECT CAUTERY BLADE TIP 2.5 (TIP) ×2
ELECT REM PT RETURN 9FT ADLT (ELECTROSURGICAL) ×2
ELECTRODE CAUTERY BLDE TIP 2.5 (TIP) ×1 IMPLANT
ELECTRODE REM PT RTRN 9FT ADLT (ELECTROSURGICAL) ×1 IMPLANT
GLOVE BIO SURGEON STRL SZ7.5 (GLOVE) ×2 IMPLANT
GLOVE INDICATOR 8.0 STRL GRN (GLOVE) ×2 IMPLANT
GOWN STRL REUS W/ TWL LRG LVL3 (GOWN DISPOSABLE) ×2 IMPLANT
GOWN STRL REUS W/TWL LRG LVL3 (GOWN DISPOSABLE) ×2
KIT MARKER MARGIN INK (KITS) IMPLANT
KIT TURNOVER KIT A (KITS) ×2 IMPLANT
LABEL OR SOLS (LABEL) IMPLANT
MARGIN MAP 10MM (MISCELLANEOUS) IMPLANT
MARKER MARGIN CORRECT CLIP (MARKER) IMPLANT
NEEDLE HYPO 22GX1.5 SAFETY (NEEDLE) ×2 IMPLANT
NEEDLE HYPO 25X1 1.5 SAFETY (NEEDLE) ×2 IMPLANT
PACK BASIN MINOR ARMC (MISCELLANEOUS) ×2 IMPLANT
RETRACTOR RING XSMALL (MISCELLANEOUS) IMPLANT
RTRCTR WOUND ALEXIS 13CM XS SH (MISCELLANEOUS)
STRIP CLOSURE SKIN 1/2X4 (GAUZE/BANDAGES/DRESSINGS) ×2 IMPLANT
SUT ETHILON 3-0 FS-10 30 BLK (SUTURE) ×2
SUT VIC AB 2-0 CT1 27 (SUTURE) ×1
SUT VIC AB 2-0 CT1 TAPERPNT 27 (SUTURE) ×1 IMPLANT
SUT VIC AB 4-0 FS2 27 (SUTURE) ×2 IMPLANT
SUTURE EHLN 3-0 FS-10 30 BLK (SUTURE) ×1 IMPLANT
SWABSTK COMLB BENZOIN TINCTURE (MISCELLANEOUS) ×2 IMPLANT
SYR 10ML LL (SYRINGE) ×2 IMPLANT
TAPE TRANSPORE STRL 2 31045 (GAUZE/BANDAGES/DRESSINGS) IMPLANT
WATER STERILE IRR 1000ML POUR (IV SOLUTION) ×2 IMPLANT

## 2019-11-06 NOTE — Discharge Instructions (Signed)

## 2019-11-06 NOTE — OR Nursing (Signed)
Dr. Bary Castilla stated we could use right arm for IV access.

## 2019-11-06 NOTE — H&P (Addendum)
Kaitlyn Potter [id number] [birth year]     HPI:  49 year old with invasive lobular carcinoma. 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 For re-excision.   Multiple sticks on the left arm, will plan for IV on the right side (s/p SLN).   Medications Prior to Admission  Medication Sig Dispense Refill Last Dose  . albuterol (VENTOLIN HFA) 108 (90 Base) MCG/ACT inhaler Inhale 2 puffs into the lungs every 6 (six) hours as needed for wheezing or shortness of breath.    11/05/2019 at Unknown time  . cetirizine (ZYRTEC) 10 MG tablet Take 10 mg by mouth daily.   11/05/2019 at Unknown time  . Cholecalciferol 25 MCG (1000 UT) capsule Take 1,000 Units by mouth daily.    11/05/2019 at Unknown time  . cyanocobalamin (,VITAMIN B-12,) 1000 MCG/ML injection Inject 1,000 mcg into the muscle every 30 (thirty) days.   11/05/2019 at Unknown time  . docusate sodium (COLACE) 100 MG capsule Take 100 mg by mouth daily.   11/05/2019 at Unknown time  . etanercept (ENBREL SURECLICK) 50 MG/ML injection Inject 50 mg into the skin every 14 (fourteen) days.    11/05/2019 at Unknown time  . ferrous sulfate 325 (65 FE) MG tablet Take 325 mg by mouth daily.    11/05/2019 at Unknown time  . fluticasone (FLONASE) 50 MCG/ACT nasal spray Place 2 sprays into both nostrils daily as needed for allergies.    11/05/2019 at Unknown time  . Glucose Blood (COOL BLOOD GLUCOSE TEST STRIPS VI) Use 3 (three) times daily Use as instructed. Freestyle Lite Test Strips E11.29   11/05/2019 at Unknown time  . HYDROcodone-acetaminophen (NORCO/VICODIN) 5-325 MG tablet Take 1 tablet by mouth every 4 (four) hours as needed for moderate pain. 20 tablet 0 11/05/2019 at Unknown time  . levothyroxine (SYNTHROID) 200 MCG tablet Take 200 mcg by mouth daily before breakfast.    11/06/2019 at 0730  . lidocaine-prilocaine (EMLA) cream APPLY TO AREOLA ONE HOUR PRIOR TO PROCEDURE AND COVER WITH SARAN WRAP   11/05/2019 at Unknown time  . losartan-hydrochlorothiazide  (HYZAAR) 50-12.5 MG tablet Take 1 tablet by mouth daily.   11/05/2019 at Unknown time  . Multiple Vitamin (MULTIVITAMIN) capsule Take 1 capsule by mouth daily.   11/05/2019 at Unknown time  . Semaglutide, 1 MG/DOSE, (OZEMPIC, 1 MG/DOSE,) 2 MG/1.5ML SOPN Inject 1 mg as directed once a week.   11/05/2019 at Unknown time  . tamoxifen (NOLVADEX) 20 MG tablet Take 1 tablet (20 mg total) by mouth daily. Start AFTER MRI is done. 60 tablet 1 11/05/2019 at Unknown time  . phentermine (ADIPEX-P) 37.5 MG tablet Take 37.5 mg by mouth daily before breakfast.       Allergies  Allergen Reactions  . Sulfa Antibiotics Itching  . Clarithromycin Other (See Comments)    Metal taste in mouth.   Past Medical History:  Diagnosis Date  . Acquired hypothyroidism   . Arthritis    psoriatic  . Asthma   . Carcinoma of upper-outer quadrant of right breast in female, estrogen receptor positive ([place])   . Heart murmur   . Hypertension   . Hypertriglyceridemia   . Psoriasis   . Type 2 diabetes mellitus with microalbuminuria, without long-term current use of insulin [facility])    Past Surgical History:  Procedure Laterality Date  . BREAST BIOPSY Right 09/08/2016   affrim stereo for calcs attempted but area to close to skin  . BREAST BIOPSY Right   . BREAST LUMPECTOMY WITH NEEDLE LOCALIZATION  Right 10/27/2019   Procedure: BREAST LUMPECTOMY WITH NEEDLE LOCALIZATION;  Surgeon: Robert Bellow, MD;  Location: [facility] ORS;  Service: General;  Laterality: Right;  and SLN  . LAPAROSCOPIC CHOLECYSTECTOMY    . lasix eye surgery    . WISDOM TOOTH EXTRACTION     Social History   Socioeconomic History  . Marital status: Single    Spouse name: Not on file  . Number of children: Not on file  . Years of education: Not on file  . Highest education level: Not on file  Occupational History  . Not on file  Tobacco Use  . Smoking status: Current Some Day Smoker  . Smokeless tobacco: Never Used  . Tobacco comment: rarely   Substance and Sexual Activity  . Alcohol use: Yes    Alcohol/week: 1.0 - 2.0 standard drinks    Types: 1 - 2 Glasses of wine per week    Comment: rarely  . Drug use: Never  . Sexual activity: Not on file  Other Topics Concern  . 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    Social Determinants of Health   Financial Resource Strain:   . Difficulty of Paying Living Expenses: Not on file  Food Insecurity:   . Worried About Charity fundraiser in the Last Year: Not on file  . Ran Out of Food in the Last Year: Not on file  Transportation Needs:   . Lack of Transportation (Medical): Not on file  . Lack of Transportation (Non-Medical): Not on file  Physical Activity:   . Days of Exercise per Week: Not on file  . Minutes of Exercise per Session: Not on file  Stress:   . Feeling of Stress : Not on file  Social Connections:   . Frequency of Communication with Friends and Family: Not on file  . Frequency of Social Gatherings with Friends and Family: Not on file  . Attends Religious Services: Not on file  . Active Member of Clubs or Organizations: Not on file  . Attends Archivist Meetings: Not on file  . Marital Status: Not on file  Intimate Partner Violence:   . Fear of Current or Ex-Partner: Not on file  . Emotionally Abused: Not on file  . Physically Abused: Not on file  . 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      ROS: Negative.     PE: HEENT: Negative. Lungs: Clear. Cardio: RR.   Assessment/Plan:  Proceed with planned endoscopy.   Forest Gleason [facility] 11/06/2019

## 2019-11-06 NOTE — Anesthesia Postprocedure Evaluation (Signed)
Anesthesia Post Note  Patient: Kaitlyn Potter  Procedure(s) Performed: RE-EXCISION OF BREAST CANCER,SUPERIOR MARGINS (Right )  Patient location during evaluation: PACU Anesthesia Type: General Level of consciousness: awake and alert and oriented Pain management: pain level controlled Vital Signs Assessment: post-procedure vital signs reviewed and stable Respiratory status: spontaneous breathing, nonlabored ventilation and respiratory function stable Cardiovascular status: blood pressure returned to baseline and stable Postop Assessment: no signs of nausea or vomiting Anesthetic complications: no     Last Vitals:  Vitals:   11/06/19 1330 11/06/19 1346  BP:  127/84  Pulse: (!) 122 (!) 126  Resp: 20 18  Temp: (!) 36.4 C 36.5 C  SpO2: 95% 97%    Last Pain:  Vitals:   11/06/19 1346  TempSrc: Temporal  PainSc: 0-No pain                 Selassie Spatafore

## 2019-11-06 NOTE — Anesthesia Preprocedure Evaluation (Signed)
Anesthesia Evaluation  Patient identified by MRN, date of birth, ID band Patient awake    Reviewed: Allergy & Precautions, NPO status , Patient's Chart, lab work & pertinent test results  History of Anesthesia Complications Negative for: history of anesthetic complications  Airway Mallampati: II  TM Distance: >3 FB Neck ROM: Full    Dental no notable dental hx.    Pulmonary asthma (URI related) , Current Smoker and Patient abstained from smoking.,    breath sounds clear to auscultation- rhonchi (-) wheezing      Cardiovascular hypertension, Pt. on medications (-) CAD, (-) Past MI, (-) Cardiac Stents and (-) CABG  Rhythm:Regular Rate:Normal - Systolic murmurs and - Diastolic murmurs    Neuro/Psych neg Seizures negative neurological ROS  negative psych ROS   GI/Hepatic negative GI ROS, Neg liver ROS,   Endo/Other  diabetesHypothyroidism   Renal/GU negative Renal ROS     Musculoskeletal  (+) Arthritis ,   Abdominal (+) + obese,   Peds  Hematology negative hematology ROS (+)   Anesthesia Other Findings Past Medical History: No date: Acquired hypothyroidism No date: Arthritis     Comment:  psoriatic No date: Asthma No date: Carcinoma of upper-outer quadrant of right breast in female,  estrogen receptor positive ([facility]) No date: Heart murmur No date: Hypertension No date: Hypertriglyceridemia No date: Psoriasis No date: Type 2 diabetes mellitus with microalbuminuria, 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 ([facility])   Reproductive/Obstetrics                             Anesthesia Physical  Anesthesia Plan  ASA: III  Anesthesia Plan: General   Post-op Pain Management:    Induction: Intravenous  PONV Risk Score and Plan: 1 and Ondansetron, Dexamethasone and Midazolam  Airway Management Planned: LMA  Additional Equipment:   Intra-op Plan:   Post-operative Plan:   Informed  Consent: I have reviewed the patients History and Physical, chart, labs and discussed the procedure including the risks, benefits and alternatives for the proposed anesthesia with the patient or authorized representative who has indicated his/her understanding and acceptance.     Dental advisory given  Plan Discussed with: CRNA and Anesthesiologist  Anesthesia Plan Comments:         Anesthesia Quick Evaluation

## 2019-11-06 NOTE — Op Note (Signed)
Preoperative diagnosis: Invasive lobular carcinoma of the right breast, anterior margin positive for DCIS.  Postoperative diagnosis: Same.  Operative procedure: Excision of anterior margin right upper outer quadrant wide excision site with overlying skin.  Operating surgeon: Hervey Ard, MD  Anesthesia: General by LMA, Marcaine 0.5% with 1: 200,000 units of epinephrine, 20 cc.  Estimated blood loss: Less than 5 cc.  Clinical note: This 49 year old woman had an area of invasive lobular carcinoma and underwent wide excision.  Final pathology showed an 11 mm area of tubular glandular invasive lobular carcinoma as well as high-grade DCIS.  She was felt to be a candidate for margin excision based on her age.  Operative note: The patient underwent general anesthesia and tolerated this well.  SCD stockings in place.  The right breast was taped medially to provide better exposure.  The area was cleansed with ChloraPrep and draped.  A curvilinear incision in the upper outer quadrant biased towards the inferior medial aspect of the old incision was then made excising the entire old excision and a 2 cm wedge of skin.  This was carried down as a block dissection to the deep tissue and orientated with the new inferior margin on a piece of Telfa.  Hemostasis was with electrocautery.  The adipose layer was then approximated with interrupted 2-0 Vicryl figure-of-eight sutures in 2 layers.  A flap of skin was made inferiorly with cautery about 5 mm in thickness to minimize rippling.  The deep dermis was approximated with interrupted 2-0 simple sutures.  The skin was closed with a running 4-0 Vicryl subcuticular suture.  Benzoin, Steri-Strips, Telfa and fluff gauze dressing followed by a compressive wrap was applied.  The patient tolerated the procedure well and was taken recovery room in stable condition.

## 2019-11-06 NOTE — Transfer of Care (Signed)
Immediate Anesthesia Transfer of Care Note  Patient: Kaitlyn Potter  Procedure(s) Performed: RE-EXCISION OF BREAST CANCER,SUPERIOR MARGINS (Right )  Patient Location: PACU  Anesthesia Type:General  Level of Consciousness: awake, alert , oriented and patient cooperative  Airway & Oxygen Therapy: Patient Spontanous Breathing  Post-op Assessment: Report given to RN and Post -op Vital signs reviewed and stable  Post vital signs: Reviewed and stable  Last Vitals:  Vitals Value Taken Time  BP 113/76 11/06/19 1254  Temp 36.3 C 11/06/19 1254  Pulse 126 11/06/19 1259  Resp 25 11/06/19 1259  SpO2 99 % 11/06/19 1259  Vitals shown include unvalidated device data.  Last Pain:  Vitals:   11/06/19 1045  TempSrc: Tympanic  PainSc: 2          Complications: No apparent anesthesia complications

## 2019-11-06 NOTE — Anesthesia Procedure Notes (Signed)
Procedure Name: LMA Insertion Performed by: Kelton Pillar, CRNA Pre-anesthesia Checklist: Patient identified, Emergency Drugs available, Suction available and Patient being monitored Patient Re-evaluated:Patient Re-evaluated prior to induction Oxygen Delivery Method: Circle system utilized Preoxygenation: Pre-oxygenation with 100% oxygen Induction Type: IV induction Ventilation: Mask ventilation without difficulty LMA: LMA inserted LMA Size: 4.0 Tube type: Oral Number of attempts: 1 Placement Confirmation: positive ETCO2,  CO2 detector and breath sounds checked- equal and bilateral Tube secured with: Tape Dental Injury: Teeth and Oropharynx as per pre-operative assessment

## 2019-11-08 LAB — SURGICAL PATHOLOGY

## 2019-11-09 ENCOUNTER — Ambulatory Visit: Payer: Managed Care, Other (non HMO)

## 2019-11-10 ENCOUNTER — Telehealth: Payer: Managed Care, Other (non HMO) | Admitting: Internal Medicine

## 2019-11-17 ENCOUNTER — Other Ambulatory Visit: Payer: Self-pay

## 2019-11-20 ENCOUNTER — Other Ambulatory Visit: Payer: Self-pay

## 2019-11-20 ENCOUNTER — Encounter: Payer: Self-pay | Admitting: Radiation Oncology

## 2019-11-20 ENCOUNTER — Inpatient Hospital Stay: Payer: Managed Care, Other (non HMO) | Attending: Internal Medicine | Admitting: Internal Medicine

## 2019-11-20 DIAGNOSIS — Z170 Estrogen receptor positive status [ER+]: Secondary | ICD-10-CM | POA: Diagnosis not present

## 2019-11-20 DIAGNOSIS — C50411 Malignant neoplasm of upper-outer quadrant of right female breast: Secondary | ICD-10-CM | POA: Diagnosis present

## 2019-11-20 NOTE — Assessment & Plan Note (Addendum)
#  Right breast cancer lobular-stage I grade 2-ER/PR positive HER-2 negative.  Status post reexcision of anterior margin; final margins negative.  #Discussed the patient will most likely not need chemotherapy; based on histology grade and stage.  However final decisions pending regarding chemotherapy based on Oncotype.  Will call patient once results available.  Discussed with Dr. Timmothy Sours last week.  #If patient does not need chemotherapy [clinically most likely]-patient will need to proceed with radiation.  Has appointment next week.   #Eventually patient will need endocrine therapy in the adjuvant basis.  Hold tamoxifen for now.  # DISPOSITION: # follow up in 6 weeks-MD- Video- Dr.B

## 2019-11-20 NOTE — Progress Notes (Signed)
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  female continue diagnosis of breast cancer lobular-status post surgery is here for follow-up.  Unfortunately status post definitive lumpectomy/sentinel lymph node-patient was found to have close/positive anterior margin for which she underwent reexcision.  Final margins negative.  Patient is healing up well from surgery.  Denies any new lumps or bumps.   Review of Systems  Constitutional: Negative for chills, diaphoresis, fever, malaise/fatigue and weight loss.  HENT: Negative for nosebleeds and sore throat.   Eyes: Negative for double vision.  Respiratory: Negative for cough, hemoptysis, sputum production, shortness of breath and wheezing.   Cardiovascular: Negative for chest pain, palpitations, orthopnea and leg swelling.  Gastrointestinal: Negative for abdominal pain, blood in stool, constipation, diarrhea, heartburn, melena, nausea and vomiting.  Genitourinary: Negative for dysuria, frequency and urgency.  Musculoskeletal: Positive for joint pain. Negative for back pain.  Skin: Negative.  Negative for itching and rash.  Neurological: Negative  for dizziness, tingling, focal weakness, weakness and headaches.  Endo/Heme/Allergies: Does not bruise/bleed easily.  Psychiatric/Behavioral: Negative for depression. The patient is not nervous/anxious and does not have insomnia.      MEDICAL HISTORY:  Past Medical History:  Diagnosis Date  . Acquired hypothyroidism   . Arthritis    psoriatic  . Asthma   . Carcinoma of upper-outer quadrant of right breast in female, estrogen receptor positive ([place])   . Heart murmur   . Hypertension   . Hypertriglyceridemia   . Psoriasis   . Type 2 diabetes mellitus with microalbuminuria, without long-term current use of insulin ([place])     SURGICAL HISTORY: Past Surgical History:  Procedure Laterality Date  . BREAST BIOPSY Right 09/08/2016   affrim stereo for calcs attempted but area to close to skin  . BREAST BIOPSY Right   . BREAST LUMPECTOMY WITH NEEDLE LOCALIZATION Right 10/27/2019   Procedure: BREAST LUMPECTOMY WITH NEEDLE LOCALIZATION;  Surgeon: Robert Bellow, MD;  Location: [facility] ORS;  Service: General;  Laterality: Right;  and SLN  . LAPAROSCOPIC CHOLECYSTECTOMY    . lasix eye surgery    . RE-EXCISION OF BREAST CANCER,SUPERIOR MARGINS Right 11/06/2019   Procedure: RE-EXCISION OF BREAST CANCER,SUPERIOR MARGINS;  Surgeon: Robert Bellow, MD;  Location: [facility] ORS;  Service: General;  Laterality: Right;  . WISDOM TOOTH EXTRACTION      SOCIAL HISTORY: Social History   Socioeconomic History  . Marital status: Single    Spouse name: Not on file  . Number of children: Not on file  . Years of education: Not on file  . Highest education level: Not on file  Occupational History  . Not on file  Tobacco Use  .  Smoking status: Current Some Day Smoker  . Smokeless tobacco: Never Used  . Tobacco comment: rarely  Substance and Sexual Activity  . Alcohol use: Yes    Alcohol/week: 1.0 - 2.0 standard drinks    Types: 1 - 2 Glasses of wine per week    Comment: rarely  . Drug use: Never   . Sexual activity: Not on file  Other Topics Concern  . 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    Social Determinants of Health   Financial Resource Strain:   . Difficulty of Paying Living Expenses: Not on file  Food Insecurity:   . Worried About Charity fundraiser in the Last Year: Not on file  . Ran Out of Food in the Last Year: Not on file  Transportation Needs:   . Lack of Transportation (Medical): Not on file  . Lack of Transportation (Non-Medical): Not on file  Physical Activity:   . Days of Exercise per Week: Not on file  . Minutes of Exercise per Session: Not on file  Stress:   . Feeling of Stress : Not on file  Social Connections:   . Frequency of Communication with Friends and Family: Not on file  . Frequency of Social Gatherings with Friends and Family: Not on file  . Attends Religious Services: Not on file  . Active Member of Clubs or Organizations: Not on file  . Attends Archivist Meetings: Not on file  . Marital Status: Not on file  Intimate Partner Violence:   . Fear of Current or Ex-Partner: Not on file  . Emotionally Abused: Not on file  . Physically Abused: Not on file  . Sexually Abused: Not on file    FAMILY HISTORY: Family History  Problem Relation Age of Onset  . Prostate cancer Paternal Grandfather   . Asthma Father   . Stroke Father   . Hyperlipidemia Father   . Diabetes Mother   . Thyroid disease Mother   . Breast cancer Neg Hx     ALLERGIES:  is allergic to sulfa antibiotics and clarithromycin.  MEDICATIONS:  Current Outpatient Medications  Medication Sig Dispense Refill  . albuterol (VENTOLIN HFA) 108 (90 Base) MCG/ACT inhaler Inhale 2 puffs into the lungs every 6 (six) hours as needed for wheezing or shortness of breath.     . cetirizine (ZYRTEC) 10 MG tablet Take 10 mg by mouth daily.    .  Cholecalciferol 25 MCG (1000 UT) capsule Take 1,000 Units by mouth daily.     . cyanocobalamin (,VITAMIN B-12,) 1000 MCG/ML injection Inject 1,000 mcg into the muscle every 30 (thirty) days.    Marland Kitchen docusate sodium (COLACE) 100 MG capsule Take 100 mg by mouth daily.    Marland Kitchen etanercept (ENBREL SURECLICK) 50 MG/ML injection Inject 50 mg into the skin every 14 (fourteen) days.     . ferrous sulfate 325 (65 FE) MG tablet Take 325 mg by mouth daily.     . fluticasone (FLONASE) 50 MCG/ACT nasal spray Place 2 sprays into both nostrils daily as needed for allergies.     . Glucose Blood (COOL BLOOD GLUCOSE TEST STRIPS VI) Use 3 (three) times daily Use as instructed. Freestyle Lite Test Strips E11.29    . levothyroxine (SYNTHROID) 200 MCG tablet Take 200 mcg by mouth daily before breakfast.     . losartan-hydrochlorothiazide (HYZAAR) 50-12.5 MG  tablet Take 1 tablet by mouth daily.    . Multiple Vitamin (MULTIVITAMIN) capsule Take 1 capsule by mouth daily.    . Semaglutide, 1 MG/DOSE, (OZEMPIC, 1 MG/DOSE,) 2 MG/1.5ML SOPN Inject 1 mg as directed once a week.    Marland Kitchen HYDROcodone-acetaminophen (NORCO/VICODIN) 5-325 MG tablet Take 1 tablet by mouth every 4 (four) hours as needed for moderate pain. 20 tablet 0  . lidocaine-prilocaine (EMLA) cream APPLY TO AREOLA ONE HOUR PRIOR TO PROCEDURE AND COVER WITH SARAN WRAP    . phentermine (ADIPEX-P) 37.5 MG tablet Take 37.5 mg by mouth daily before breakfast.     . tamoxifen (NOLVADEX) 20 MG tablet Take 1 tablet (20 mg total) by mouth daily. Start AFTER MRI is done. (Patient not taking: Reported on 11/17/2019) 60 tablet 1   No current facility-administered medications for this visit.      Marland Kitchen  PHYSICAL EXAMINATION: ECOG PERFORMANCE STATUS: 0 - Asymptomatic  Vitals:   11/20/19 1028  BP: 122/88  Pulse: (!) 112  Resp: 20  Temp: (!) 97 F (36.1 C)  SpO2: 99%   Filed Weights   11/20/19 1028  Weight: 255 lb 9.6 oz (115.9 kg)    Physical Exam  Constitutional: She is  oriented to person, place, and time and well-developed, well-nourished, and in no distress.  Patient is alone.  HENT:  Head: Normocephalic and atraumatic.  Mouth/Throat: Oropharynx is clear and moist. No oropharyngeal exudate.  Eyes: Pupils are equal, round, and reactive to light.  Cardiovascular: Normal rate and regular rhythm.  Pulmonary/Chest: Effort normal and breath sounds normal. No respiratory distress. She has no wheezes.  Abdominal: Soft. Bowel sounds are normal. She exhibits no distension and no mass. There is no abdominal tenderness. There is no rebound and no guarding.  Musculoskeletal:        General: No tenderness or edema. Normal range of motion.     Cervical back: Normal range of motion and neck supple.  Neurological: She is alert and oriented to person, place, and time.  Skin: Skin is warm.  Psychiatric: Affect normal.   LABORATORY DATA:  I have reviewed the data as listed Lab Results  Component Value Date   WBC 8.6 09/22/2019   HGB 14.5 09/22/2019   HCT 43.7 09/22/2019   MCV 93.4 09/22/2019   PLT 308 09/22/2019   Recent Labs    09/22/19 1220  NA 136  K 3.3*  CL 100  CO2 25  GLUCOSE 193*  BUN 9  CREATININE 0.77  CALCIUM 9.1  GFRNONAA >60  GFRAA >60  PROT 8.3*  ALBUMIN 4.5  AST 37  ALT 38  ALKPHOS 88  BILITOT 0.9    RADIOGRAPHIC STUDIES: I have personally reviewed the radiological images as listed and agreed with the findings in the report. NM SENTINEL NODE INJECTION  Result Date: 10/27/2019 CLINICAL DATA:  Right breast cancer. EXAM: NUCLEAR MEDICINE BREAST LYMPHOSCINTIGRAPHY , right breast TECHNIQUE: Intradermal injection of radiopharmaceutical was performed at the 12 o'clock, 3 o'clock, 6 o'clock, and 9 o'clock positions around the right nipple. The patient was then sent to the operating room where the sentinel node(s) were identified and removed by the surgeon. RADIOPHARMACEUTICALS:  Total of 0.9 mCi Millipore-filtered Technetium-67msulfur  colloid, injected in four aliquots. IMPRESSION: Uncomplicated intradermal injection of a total of 0.9 mCi Technetium-930mulfur colloid for purposes of sentinel node identification. Electronically Signed   By: DaDorise BullionII M.D   On: 10/27/2019 10:25   MM Breast Surgical Specimen  Result  Date: 10/27/2019 CLINICAL DATA:  Status post excisional biopsy of the right breast. Specimen radiograph. EXAM: SPECIMEN RADIOGRAPH OF THE RIGHT BREAST COMPARISON:  Previous exam(s). FINDINGS: Status post excision of the right breast. The wire tip and biopsy marker clip are present and are marked for pathology. IMPRESSION: Specimen radiograph of the right breast. Electronically Signed   By: Lillia Mountain M.D.   On: 10/27/2019 12:03   MM RT PLC BREAST LOC DEV   1ST LESION  INC MAMMO GUIDE  Result Date: 10/27/2019 CLINICAL DATA:  Needle localization of the right breast prior to surgery. EXAM: NEEDLE LOCALIZATION OF THE RIGHT BREAST WITH MAMMO GUIDANCE COMPARISON:  Previous exams. FINDINGS: Patient presents for needle localization prior to surgery. I met with the patient and we discussed the procedure of needle localization including benefits and alternatives. We discussed the high likelihood of a successful procedure. We discussed the risks of the procedure, including infection, bleeding, tissue injury, and further surgery. Informed, written consent was given. The usual time-out protocol was performed immediately prior to the procedure. Using mammographic guidance, sterile technique, 1% lidocaine and a #5 modified Kopans needle, the ribbon shaped clip was localized using lateral to medial approach. The images were marked for Dr. Bary Castilla. IMPRESSION: Needle localization the right breast. No apparent complications. Electronically Signed   By: Lillia Mountain M.D.   On: 10/27/2019 09:07    ASSESSMENT & PLAN:   Carcinoma of upper-outer quadrant of right breast in female, estrogen receptor positive ([place]) #Right breast cancer  lobular-stage I grade 2-ER/PR positive HER-2 negative.  Status post reexcision of anterior margin; final margins negative.  #Discussed the patient will most likely not need chemotherapy; based on histology grade and stage.  However final decisions pending regarding chemotherapy based on Oncotype.  Will call patient once results available.  Discussed with Dr. Timmothy Sours last week.  #If patient does not need chemotherapy [clinically most likely]-patient will need to proceed with radiation.  Has appointment next week.   #Eventually patient will need endocrine therapy in the adjuvant basis.  Hold tamoxifen for now.  # DISPOSITION: # follow up in 6 weeks-MD- Video- Dr.B   All questions were answered. The patient/family knows to call the clinic with any problems, questions or concerns.     Cammie Sickle, MD 11/20/2019 11:44 AM

## 2019-11-21 ENCOUNTER — Other Ambulatory Visit: Payer: Self-pay

## 2019-11-21 ENCOUNTER — Ambulatory Visit
Admission: RE | Admit: 2019-11-21 | Discharge: 2019-11-21 | Disposition: A | Discharge status: Home / Self Care | Payer: Managed Care, Other (non HMO) | Source: Ambulatory Visit | Attending: Radiation Oncology | Admitting: Radiation Oncology

## 2019-11-21 VITALS — BP 121/85 | HR 108 | Temp 96.3°F | Wt 256.6 lb

## 2019-11-21 DIAGNOSIS — I10 Essential (primary) hypertension: Secondary | ICD-10-CM | POA: Diagnosis not present

## 2019-11-21 DIAGNOSIS — E039 Hypothyroidism, unspecified: Secondary | ICD-10-CM | POA: Insufficient documentation

## 2019-11-21 DIAGNOSIS — M1990 Unspecified osteoarthritis, unspecified site: Secondary | ICD-10-CM | POA: Diagnosis not present

## 2019-11-21 DIAGNOSIS — Z79899 Other long term (current) drug therapy: Secondary | ICD-10-CM | POA: Insufficient documentation

## 2019-11-21 DIAGNOSIS — E119 Type 2 diabetes mellitus without complications: Secondary | ICD-10-CM | POA: Diagnosis not present

## 2019-11-21 DIAGNOSIS — Z170 Estrogen receptor positive status [ER+]: Secondary | ICD-10-CM | POA: Insufficient documentation

## 2019-11-21 DIAGNOSIS — C50411 Malignant neoplasm of upper-outer quadrant of right female breast: Secondary | ICD-10-CM | POA: Insufficient documentation

## 2019-11-21 DIAGNOSIS — F17210 Nicotine dependence, cigarettes, uncomplicated: Secondary | ICD-10-CM | POA: Insufficient documentation

## 2019-11-21 DIAGNOSIS — J45909 Unspecified asthma, uncomplicated: Secondary | ICD-10-CM | POA: Diagnosis not present

## 2019-11-21 DIAGNOSIS — E781 Pure hyperglyceridemia: Secondary | ICD-10-CM | POA: Insufficient documentation

## 2019-11-21 NOTE — Consult Note (Signed)
NEW PATIENT EVALUATION  Name: Kaitlyn Potter  MRN: [id number]  Date:   11/21/2019     DOB: [birth year]   This 49 y.o. female patient presents to the clinic for initial evaluation of stage Ia invasive mammary carcinoma with lobular features of the right breast T1 cN0 M0 ER/PR positive status post wide local excision and reexcision.  REFERRING PHYSICIAN: Adin Hector, MD  CHIEF COMPLAINT:  Chief Complaint  Patient presents with  . Breast Cancer    DIAGNOSIS: The encounter diagnosis was Carcinoma of upper-outer quadrant of right breast in female, estrogen receptor positive ([place]).   PREVIOUS INVESTIGATIONS:  Mammogram and ultrasound reviewed Clinical notes reviewed Pathology report reviewed  HPI: Patient is a 49 year old female who presented with an abnormal mammogram showing a suspicious mass in the right breast at the 10 o'clock position 7 cm from the nipple measuring 4 mm.  She underwent targeted ultrasound which was positive for a 1.1 cm invasive lobular carcinoma.  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  Invasive carcinoma margin was negative a 2.8 mm there was a unifocal positive DCIS margin although patient underwent reexcision with clear margin.  Her initial ultrasound showed no enlarged or morphologic adenopathy abnormal lymph nodes.  1 sentinel lymph node was also negative for metastatic disease.  Tumor was ER/PR positive HER-2/neu not overexpressed.  Oncotype is currently pending.  She is done well postoperatively still somewhat sore.  She is large breasted woman she has been offered both whole breast radiation as well as accelerated partial breast radiation.  PLANNED TREATMENT REGIMEN: Accelerated partial breast radiation  PAST MEDICAL HISTORY:  has a past medical history of Acquired hypothyroidism, Arthritis, Asthma, Carcinoma of upper-outer quadrant of right breast in female, estrogen receptor positive ([place]), Heart murmur, Hypertension,  Hypertriglyceridemia, Psoriasis, and Type 2 diabetes mellitus with microalbuminuria, without long-term current use of insulin ([place]).    PAST SURGICAL HISTORY:  Past Surgical History:  Procedure Laterality Date  . BREAST BIOPSY Right 09/08/2016   affrim stereo for calcs attempted but area to close to skin  . BREAST BIOPSY Right   . BREAST LUMPECTOMY WITH NEEDLE LOCALIZATION Right 10/27/2019   Procedure: BREAST LUMPECTOMY WITH NEEDLE LOCALIZATION;  Surgeon: Robert Bellow, MD;  Location: [facility] ORS;  Service: General;  Laterality: Right;  and SLN  . LAPAROSCOPIC CHOLECYSTECTOMY    . lasix eye surgery    . RE-EXCISION OF BREAST CANCER,SUPERIOR MARGINS Right 11/06/2019   Procedure: RE-EXCISION OF BREAST CANCER,SUPERIOR MARGINS;  Surgeon: Robert Bellow, MD;  Location: [facility] ORS;  Service: General;  Laterality: Right;  . WISDOM TOOTH EXTRACTION      FAMILY HISTORY: family history includes Asthma in her father; Diabetes in her mother; Hyperlipidemia in her father; Prostate cancer in her paternal grandfather; Stroke in her father; Thyroid disease in her mother.  SOCIAL HISTORY:  reports that she has been smoking. She has never used smokeless tobacco. She reports current alcohol use of about 1.0 - 2.0 standard drinks of alcohol per week. She reports that she does not use drugs.  ALLERGIES: Sulfa antibiotics and Clarithromycin  MEDICATIONS:  Current Outpatient Medications  Medication Sig Dispense Refill  . albuterol (VENTOLIN HFA) 108 (90 Base) MCG/ACT inhaler Inhale 2 puffs into the lungs every 6 (six) hours as needed for wheezing or shortness of breath.     . cetirizine (ZYRTEC) 10 MG tablet Take 10 mg by mouth daily.    . Cholecalciferol 25 MCG (1000 UT) capsule  Take 1,000 Units by mouth daily.     . cyanocobalamin (,VITAMIN B-12,) 1000 MCG/ML injection Inject 1,000 mcg into the muscle every 30 (thirty) days.    Marland Kitchen docusate sodium (COLACE) 100 MG capsule Take 100 mg by mouth daily.    Marland Kitchen  etanercept (ENBREL SURECLICK) 50 MG/ML injection Inject 50 mg into the skin every 14 (fourteen) days.     . ferrous sulfate 325 (65 FE) MG tablet Take 325 mg by mouth daily.     . fluticasone (FLONASE) 50 MCG/ACT nasal spray Place 2 sprays into both nostrils daily as needed for allergies.     . Glucose Blood (COOL BLOOD GLUCOSE TEST STRIPS VI) Use 3 (three) times daily Use as instructed. Freestyle Lite Test Strips E11.29    . levothyroxine (SYNTHROID) 200 MCG tablet Take 200 mcg by mouth daily before breakfast.     . losartan-hydrochlorothiazide (HYZAAR) 50-12.5 MG tablet Take 1 tablet by mouth daily.    . Multiple Vitamin (MULTIVITAMIN) capsule Take 1 capsule by mouth daily.    . Semaglutide, 1 MG/DOSE, (OZEMPIC, 1 MG/DOSE,) 2 MG/1.5ML SOPN Inject 1 mg as directed once a week.    . tamoxifen (NOLVADEX) 20 MG tablet Take 1 tablet (20 mg total) by mouth daily. Start AFTER MRI is done. 60 tablet 1  . HYDROcodone-acetaminophen (NORCO/VICODIN) 5-325 MG tablet Take 1 tablet by mouth every 4 (four) hours as needed for moderate pain. 20 tablet 0  . lidocaine-prilocaine (EMLA) cream APPLY TO AREOLA ONE HOUR PRIOR TO PROCEDURE AND COVER WITH SARAN WRAP    . phentermine (ADIPEX-P) 37.5 MG tablet Take 37.5 mg by mouth daily before breakfast.      No current facility-administered medications for this encounter.    ECOG PERFORMANCE STATUS:  0 - Asymptomatic  REVIEW OF SYSTEMS: Patient denies any weight loss, fatigue, weakness, fever, chills or night sweats. Patient denies any loss of vision, blurred vision. Patient denies any ringing  of the ears or hearing loss. No irregular heartbeat. Patient denies heart murmur or history of fainting. Patient denies any chest pain or pain radiating to her upper extremities. Patient denies any shortness of breath, difficulty breathing at night, cough or hemoptysis. Patient denies any swelling in the lower legs. Patient denies any nausea vomiting, vomiting of blood, or coffee  ground material in the vomitus. Patient denies any stomach pain. Patient states has had normal bowel movements no significant constipation or diarrhea. Patient denies any dysuria, hematuria or significant nocturia. Patient denies any problems walking, swelling in the joints or loss of balance. Patient denies any skin changes, loss of hair or loss of weight. Patient denies any excessive worrying or anxiety or significant depression. Patient denies any problems with insomnia. Patient denies excessive thirst, polyuria, polydipsia. Patient denies any swollen glands, patient denies easy bruising or easy bleeding. Patient denies any recent infections, allergies or URI. Patient "s visual fields have not changed significantly in recent time.   PHYSICAL EXAM: BP 121/85   Pulse (!) 108   Temp (!) 96.3 F (35.7 C)   Wt 256 lb 9.6 oz (116.4 kg)   BMI 40.19 kg/m  Patient does have large pendulous breasts.  She is status post recent wide local excision of the right breast incision is well-healed.  No dominant mass or nodularity is noted in either breast in 2 positions examined.  No axillary or supraclavicular adenopathy is identified.  Well-developed well-nourished patient in NAD. HEENT reveals PERLA, EOMI, discs not visualized.  Oral cavity is clear.  No oral mucosal lesions are identified. Neck is clear without evidence of cervical or supraclavicular adenopathy. Lungs are clear to A&P. Cardiac examination is essentially unremarkable with regular rate and rhythm without murmur rub or thrill. Abdomen is benign with no organomegaly or masses noted. Motor sensory and DTR levels are equal and symmetric in the upper and lower extremities. Cranial nerves II through XII are grossly intact. Proprioception is intact. No peripheral adenopathy or edema is identified. No motor or sensory levels are noted. Crude visual fields are within normal range.  LABORATORY DATA: Pathology report reviewed    RADIOLOGY RESULTS: Mammogram  and ultrasound reviewed compatible with above-stated findings   IMPRESSION: Stage Ia invasive lobular carcinoma the right breast status post wide local excision and sentinel node biopsy with reexcision for clear margins in 49 year old female  PLAN: This time of gone over recommendations for Pap for both accelerated partial breast radiation as well as whole breast radiation.  Certainly with whole breast radiation she would have a significant skin reaction based on her large breast.  I have discussed accelerated partial breast radiation placement of MammoSite balloon catheter by Dr. Tollie Pizza.  Would treat up to 3400 cGy at 340 centigrade twice daily using high-dose-rate remote afterloading.  Risks and benefits of that treatment including formation of scar tissue in the lumpectomy site small chance of small skin reaction as well as fatigue all were discussed in detail.  Patient has requested accelerated partial breast radiation and we will contact Dr. Barbette Hair office to schedule MammoSite balloon placement as well as follow-up simulation and treatment.  Patient comprehends my treatment plan and recommendations well.  She also will be a candidate for antiestrogen therapy after completion of radiation.  In the small chance Oncotype DX shows need for chemotherapy we still could proceed with accelerated partial breast radiation prior to chemotherapy if that is the case.  I would like to take this opportunity to thank you for allowing me to participate in the care of your patient.Noreene Filbert, MD

## 2019-11-24 ENCOUNTER — Encounter: Payer: Self-pay | Admitting: General Surgery

## 2019-12-08 ENCOUNTER — Ambulatory Visit: Payer: Managed Care, Other (non HMO)

## 2019-12-08 DIAGNOSIS — Z170 Estrogen receptor positive status [ER+]: Secondary | ICD-10-CM | POA: Diagnosis not present

## 2019-12-08 DIAGNOSIS — C50411 Malignant neoplasm of upper-outer quadrant of right female breast: Secondary | ICD-10-CM | POA: Insufficient documentation

## 2019-12-10 DIAGNOSIS — C50411 Malignant neoplasm of upper-outer quadrant of right female breast: Secondary | ICD-10-CM | POA: Diagnosis not present

## 2019-12-11 ENCOUNTER — Ambulatory Visit
Admission: RE | Admit: 2019-12-11 | Discharge: 2019-12-11 | Disposition: A | Discharge status: Home / Self Care | Payer: Managed Care, Other (non HMO) | Source: Ambulatory Visit | Attending: Radiation Oncology | Admitting: Radiation Oncology

## 2019-12-11 ENCOUNTER — Ambulatory Visit: Payer: Managed Care, Other (non HMO)

## 2019-12-11 DIAGNOSIS — C50411 Malignant neoplasm of upper-outer quadrant of right female breast: Secondary | ICD-10-CM | POA: Diagnosis not present

## 2019-12-12 ENCOUNTER — Ambulatory Visit
Admission: RE | Admit: 2019-12-12 | Discharge: 2019-12-12 | Disposition: A | Discharge status: Home / Self Care | Payer: Managed Care, Other (non HMO) | Source: Ambulatory Visit | Attending: Radiation Oncology | Admitting: Radiation Oncology

## 2019-12-12 DIAGNOSIS — C50411 Malignant neoplasm of upper-outer quadrant of right female breast: Secondary | ICD-10-CM | POA: Diagnosis not present

## 2019-12-13 ENCOUNTER — Ambulatory Visit
Admission: RE | Admit: 2019-12-13 | Discharge: 2019-12-13 | Disposition: A | Discharge status: Home / Self Care | Payer: Managed Care, Other (non HMO) | Source: Ambulatory Visit | Attending: Radiation Oncology | Admitting: Radiation Oncology

## 2019-12-13 DIAGNOSIS — C50411 Malignant neoplasm of upper-outer quadrant of right female breast: Secondary | ICD-10-CM | POA: Diagnosis not present

## 2019-12-14 ENCOUNTER — Ambulatory Visit
Admission: RE | Admit: 2019-12-14 | Discharge: 2019-12-14 | Disposition: A | Discharge status: Home / Self Care | Payer: Managed Care, Other (non HMO) | Source: Ambulatory Visit | Attending: Radiation Oncology | Admitting: Radiation Oncology

## 2019-12-14 DIAGNOSIS — C50411 Malignant neoplasm of upper-outer quadrant of right female breast: Secondary | ICD-10-CM | POA: Diagnosis present

## 2019-12-14 DIAGNOSIS — Z170 Estrogen receptor positive status [ER+]: Secondary | ICD-10-CM | POA: Insufficient documentation

## 2019-12-15 ENCOUNTER — Ambulatory Visit
Admission: RE | Admit: 2019-12-15 | Discharge: 2019-12-15 | Disposition: A | Discharge status: Home / Self Care | Payer: Managed Care, Other (non HMO) | Source: Ambulatory Visit | Attending: Radiation Oncology | Admitting: Radiation Oncology

## 2019-12-15 DIAGNOSIS — C50411 Malignant neoplasm of upper-outer quadrant of right female breast: Secondary | ICD-10-CM | POA: Diagnosis not present

## 2019-12-18 ENCOUNTER — Ambulatory Visit: Payer: Managed Care, Other (non HMO)

## 2019-12-19 ENCOUNTER — Ambulatory Visit: Payer: Managed Care, Other (non HMO)

## 2019-12-29 ENCOUNTER — Encounter: Payer: Self-pay | Admitting: Internal Medicine

## 2019-12-29 ENCOUNTER — Other Ambulatory Visit: Payer: Self-pay

## 2020-01-01 ENCOUNTER — Inpatient Hospital Stay: Payer: Managed Care, Other (non HMO) | Attending: Internal Medicine | Admitting: Internal Medicine

## 2020-01-01 DIAGNOSIS — Z170 Estrogen receptor positive status [ER+]: Secondary | ICD-10-CM | POA: Diagnosis not present

## 2020-01-01 DIAGNOSIS — C50411 Malignant neoplasm of upper-outer quadrant of right female breast: Secondary | ICD-10-CM

## 2020-01-01 NOTE — Progress Notes (Signed)
I connected with Fara Olden on 01/01/20 at  3:15 PM EDT by video enabled telemedicine visit and verified that I am speaking with the correct person using two identifiers.  I discussed the limitations, risks, security and privacy concerns of performing an evaluation and management service by telemedicine and the availability of in-person appointments. I also discussed with the patient that there may be a patient responsible charge related to this service. The patient expressed understanding and agreed to proceed.    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  female with history of stage I breast cancer ER/PR positive HER-2 negative is here for follow-up.  Patient is currently status post MammoSite radiation.  Currently doing well.  Denies any unusual aches and pains or bone pain.   Observation/objective:  Assessment and plan: Carcinoma of upper-outer quadrant of right breast in female, estrogen receptor positive ([place]) #Right breast cancer lobular-stage I grade 2-ER/PR positive HER-2 negative.  Oncotype recurrence score 17; premenopausal patient/age less than 50-benefit from chemotherapy is  1.5% absolute.  Given the low overall benefit of chemotherapy-I think is reasonable to avoid chemotherapy.  Currently s/p radiation.  #Proceed with tamoxifen; discussed the rationale and potential side effects. #Discussed the role of anti-hormonal therapy mechanism of action; since patient is premenopausal recommend tamoxifen.  Recommend tamoxifen for 5 years; however duration of the treatment could be stretched out to up to 10 years based on other risk factors.  #  Long discussion regarding the potential adverse events on tamoxifen including but not limited to hot flashes, mood swings, thromboembolic events strokes and also small risk of uterine cancers.  Patient has a prescription of tamoxifen available to start tomorrow.   # DISPOSITION: # follow up in 3 months-MD- no labs- Dr.B   Follow-up instructions:  I discussed the assessment and treatment plan with the patient.  The patient was provided an opportunity to ask questions and all were answered.  The patient agreed with the plan and demonstrated understanding of instructions.  The patient was advised to call back or seek an in person evaluation if the symptoms worsen or if the condition fails to improve as anticipated.  Dr. Charlaine Dalton [place] at [facility] 01/01/2020 3:43 PM

## 2020-01-01 NOTE — Assessment & Plan Note (Addendum)
#  Right breast cancer lobular-stage I grade 2-ER/PR positive HER-2 negative.  Oncotype recurrence score 17; premenopausal patient/age less than 50-benefit from chemotherapy is 1.5% absolute.  Given the low overall benefit of chemotherapy-I think is reasonable to avoid chemotherapy.  Currently s/p radiation.  #Proceed with tamoxifen; discussed the rationale and potential side effects. #Discussed the role of anti-hormonal therapy mechanism of action; since patient is premenopausal recommend tamoxifen.  Recommend tamoxifen for 5 years; however duration of the treatment could be stretched out to up to 10 years based on other risk factors.  #  Long discussion regarding the potential adverse events on tamoxifen including but not limited to hot flashes, mood swings, thromboembolic events strokes and also small risk of uterine cancers.  Patient has a prescription of tamoxifen available to start tomorrow.   # DISPOSITION: # follow up in 3 months-MD- no labs- Dr.B

## 2020-01-04 ENCOUNTER — Encounter: Payer: Self-pay | Admitting: *Deleted

## 2020-01-19 ENCOUNTER — Ambulatory Visit
Admission: RE | Admit: 2020-01-19 | Payer: Managed Care, Other (non HMO) | Source: Ambulatory Visit | Admitting: Radiation Oncology

## 2020-02-09 ENCOUNTER — Other Ambulatory Visit: Payer: Self-pay

## 2020-02-09 ENCOUNTER — Ambulatory Visit
Admission: RE | Admit: 2020-02-09 | Discharge: 2020-02-09 | Disposition: A | Discharge status: Home / Self Care | Payer: Managed Care, Other (non HMO) | Source: Ambulatory Visit | Attending: Radiation Oncology | Admitting: Radiation Oncology

## 2020-02-09 ENCOUNTER — Encounter: Payer: Self-pay | Admitting: Radiation Oncology

## 2020-02-09 VITALS — BP 131/85 | HR 101 | Temp 97.6°F | Resp 16 | Wt 233.0 lb

## 2020-02-09 DIAGNOSIS — C50411 Malignant neoplasm of upper-outer quadrant of right female breast: Secondary | ICD-10-CM | POA: Diagnosis not present

## 2020-02-09 DIAGNOSIS — Z79810 Long term (current) use of selective estrogen receptor modulators (SERMs): Secondary | ICD-10-CM | POA: Diagnosis not present

## 2020-02-09 DIAGNOSIS — Z170 Estrogen receptor positive status [ER+]: Secondary | ICD-10-CM | POA: Diagnosis not present

## 2020-02-09 DIAGNOSIS — Z923 Personal history of irradiation: Secondary | ICD-10-CM | POA: Diagnosis not present

## 2020-02-09 NOTE — Progress Notes (Signed)
Radiation Oncology Follow up Note  Name: Kaitlyn Potter   Date:   02/09/2020 MRN:  [id number] DOB: 12/11/1970    This 49 y.o. female presents to the clinic today for 1 month follow-up status post accelerated partial breast radiation to her right breast for invasive mammary carcinoma stage Ic.  REFERRING PROVIDER: Adin Hector, MD  HPI: Patient is a 49 year old female now 1 month out from accelerated partial breast radiation to her right breast for T1 cN0 M0 ER/PR positive invasive mammary carcinoma with lobular features.  Seen today in routine follow-up she is doing well.  She specifically denies breast tenderness cough or bone pain..  She has been started on tamoxifen tolerating that well without side effect.  COMPLICATIONS OF TREATMENT: none  FOLLOW UP COMPLIANCE: keeps appointments   PHYSICAL EXAM:  BP 131/85 (BP Location: Left Arm, Patient Position: Sitting, Cuff Size: Large)   Pulse (!) 101   Temp 97.6 F (36.4 C)   Resp 16   Wt 233 lb (105.7 kg)   BMI 36.49 kg/m  Lungs are clear to A&P cardiac examination essentially unremarkable with regular rate and rhythm. No dominant mass or nodularity is noted in either breast in 2 positions examined. Incision is well-healed. No axillary or supraclavicular adenopathy is appreciated. Cosmetic result is excellent.  Well-developed well-nourished patient in NAD. HEENT reveals PERLA, EOMI, discs not visualized.  Oral cavity is clear. No oral mucosal lesions are identified. Neck is clear without evidence of cervical or supraclavicular adenopathy. Lungs are clear to A&P. Cardiac examination is essentially unremarkable with regular rate and rhythm without murmur rub or thrill. Abdomen is benign with no organomegaly or masses noted. Motor sensory and DTR levels are equal and symmetric in the upper and lower extremities. Cranial nerves II through XII are grossly intact. Proprioception is intact. No peripheral adenopathy or edema is identified. No  motor or sensory levels are noted. Crude visual fields are within normal range.  RADIOLOGY RESULTS: No current films to review  PLAN: Present time patient is doing well very low side effect profile from her accelerated partial breast radiation.  She is currently on tamoxifen tolerating that well.  I have asked to see her back in 4 to 5 months for follow-up.  Patient knows to call with any concerns.  I would like to take this opportunity to thank you for allowing me to participate in the care of your patient.Noreene Filbert, MD

## 2020-02-14 ENCOUNTER — Telehealth: Payer: Self-pay

## 2020-02-14 NOTE — Telephone Encounter (Signed)
T/C to patient to discuss SCP visit.   No answer but left message and asked pt to return my call so we can discuss SCP visit.   Waiting for return call.

## 2020-04-01 ENCOUNTER — Inpatient Hospital Stay: Payer: Managed Care, Other (non HMO) | Admitting: Internal Medicine

## 2020-04-02 ENCOUNTER — Other Ambulatory Visit: Payer: Self-pay

## 2020-04-02 ENCOUNTER — Inpatient Hospital Stay: Payer: Managed Care, Other (non HMO) | Attending: Internal Medicine | Admitting: Internal Medicine

## 2020-04-02 DIAGNOSIS — Z170 Estrogen receptor positive status [ER+]: Secondary | ICD-10-CM

## 2020-04-02 DIAGNOSIS — E781 Pure hyperglyceridemia: Secondary | ICD-10-CM | POA: Diagnosis not present

## 2020-04-02 DIAGNOSIS — J45909 Unspecified asthma, uncomplicated: Secondary | ICD-10-CM | POA: Insufficient documentation

## 2020-04-02 DIAGNOSIS — C50411 Malignant neoplasm of upper-outer quadrant of right female breast: Secondary | ICD-10-CM | POA: Diagnosis not present

## 2020-04-02 DIAGNOSIS — G4700 Insomnia, unspecified: Secondary | ICD-10-CM | POA: Insufficient documentation

## 2020-04-02 DIAGNOSIS — I10 Essential (primary) hypertension: Secondary | ICD-10-CM | POA: Diagnosis not present

## 2020-04-02 DIAGNOSIS — E039 Hypothyroidism, unspecified: Secondary | ICD-10-CM | POA: Diagnosis not present

## 2020-04-02 DIAGNOSIS — Z8041 Family history of malignant neoplasm of ovary: Secondary | ICD-10-CM | POA: Insufficient documentation

## 2020-04-02 DIAGNOSIS — F17210 Nicotine dependence, cigarettes, uncomplicated: Secondary | ICD-10-CM | POA: Insufficient documentation

## 2020-04-02 DIAGNOSIS — E119 Type 2 diabetes mellitus without complications: Secondary | ICD-10-CM | POA: Diagnosis not present

## 2020-04-02 DIAGNOSIS — Z79899 Other long term (current) drug therapy: Secondary | ICD-10-CM | POA: Insufficient documentation

## 2020-04-02 DIAGNOSIS — Z8349 Family history of other endocrine, nutritional and metabolic diseases: Secondary | ICD-10-CM | POA: Diagnosis not present

## 2020-04-02 DIAGNOSIS — Z79810 Long term (current) use of selective estrogen receptor modulators (SERMs): Secondary | ICD-10-CM | POA: Insufficient documentation

## 2020-04-02 DIAGNOSIS — Z833 Family history of diabetes mellitus: Secondary | ICD-10-CM | POA: Insufficient documentation

## 2020-04-02 NOTE — Assessment & Plan Note (Addendum)
#  Right breast cancer lobular-stage I grade 2-ER/PR positive HER-2 negative.  Currently s/p radiation.  Stable.  Currently on tamoxifen. Tolerating well.   # Insomnia- recommend melatonin nightly as needed.  # DISPOSITION: # follow up in 4 months-MD- no labs- Dr.B

## 2020-04-02 NOTE — Progress Notes (Signed)
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 S/p RT -mammosite  # April 20/2021- START TAMOXIFEN  # DM; Psoriatic arthritis [Enbrel;Dr.Kernodle];   # SURVIVORSHIP: pending  DIAGNOSIS: Breast cancer  STAGE:  I       ;  GOALS: cure  CURRENT/MOST RECENT THERAPY : Tamoxifen        Carcinoma of upper-outer quadrant of right breast in female, estrogen receptor positive ([place])  09/22/2019 Initial Diagnosis   Carcinoma of upper-outer quadrant of right breast in female, estrogen receptor positive ([place])      HISTORY OF PRESENTING ILLNESS:  Kaitlyn Potter 49 y.o.  female stage I lobular cancer status post radiation is here for follow-up.  Patient continues to tamoxifen.  Tolerating well.  No significant hot flashes or mood swings.  Complains of mild difficulty sleeping at night.   Review of Systems  Constitutional: Negative for chills, diaphoresis, fever, malaise/fatigue and weight loss.  HENT: Negative for nosebleeds and sore throat.   Eyes: Negative for double vision.  Respiratory: Negative for cough, hemoptysis, sputum production, shortness of breath and wheezing.   Cardiovascular: Negative for chest pain, palpitations, orthopnea and leg swelling.  Gastrointestinal: Negative for abdominal pain, blood in stool, constipation, diarrhea, heartburn, melena, nausea and vomiting.   Genitourinary: Negative for dysuria, frequency and urgency.  Musculoskeletal: Positive for joint pain. Negative for back pain.  Skin: Negative.  Negative for itching and rash.  Neurological: Negative for dizziness, tingling, focal weakness, weakness and headaches.  Endo/Heme/Allergies: Does not bruise/bleed easily.  Psychiatric/Behavioral: Negative for depression. The patient has insomnia. The patient is not nervous/anxious.      MEDICAL HISTORY:  Past Medical History:  Diagnosis Date  . Acquired hypothyroidism   . Arthritis    psoriatic  . Asthma   . Carcinoma of upper-outer quadrant of right breast in female, estrogen receptor positive ([place])   . Heart murmur   . Hypertension   . Hypertriglyceridemia   . Psoriasis   . Type 2 diabetes mellitus with microalbuminuria, without long-term current use of insulin ([place])     SURGICAL HISTORY: Past Surgical History:  Procedure Laterality Date  . BREAST BIOPSY Right 09/08/2016   affrim stereo for calcs attempted but area to close to skin  . BREAST BIOPSY Right   . BREAST LUMPECTOMY WITH NEEDLE LOCALIZATION Right 10/27/2019   Procedure: BREAST LUMPECTOMY WITH NEEDLE LOCALIZATION;  Surgeon: Robert Bellow, MD;  Location: [facility] ORS;  Service: General;  Laterality: Right;  and SLN  . LAPAROSCOPIC CHOLECYSTECTOMY    . lasix eye surgery    . RE-EXCISION OF BREAST CANCER,SUPERIOR MARGINS Right 11/06/2019   Procedure: RE-EXCISION OF BREAST CANCER,SUPERIOR MARGINS;  Surgeon: Robert Bellow, MD;  Location: [facility] ORS;  Service: General;  Laterality: Right;  . WISDOM TOOTH EXTRACTION      SOCIAL HISTORY: Social History   Socioeconomic History  . Marital status: Single    Spouse name: Not on file  . Number of children: Not on  file  . Years of education: Not on file  . Highest education level: Not on file  Occupational History  . Not on file  Tobacco Use  . Smoking status: Current Some Day Smoker  . Smokeless tobacco: Never Used  .  Tobacco comment: rarely  Vaping Use  . Vaping Use: Never used  Substance and Sexual Activity  . Alcohol use: Yes    Alcohol/week: 1.0 - 2.0 standard drink    Types: 1 - 2 Glasses of wine per week    Comment: rarely  . Drug use: Never  . Sexual activity: Not on file  Other Topics Concern  . 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    Social Determinants of Health   Financial Resource Strain:   . Difficulty of Paying Living Expenses:   Food Insecurity:   . Worried About Charity fundraiser in the Last Year:   . Arboriculturist in the Last Year:   Transportation Needs:   . Film/video editor (Medical):   Marland Kitchen Lack of Transportation (Non-Medical):   Physical Activity:   . Days of Exercise per Week:   . Minutes of Exercise per Session:   Stress:   . Feeling of Stress :   Social Connections:   . Frequency of Communication with Friends and Family:   . Frequency of Social Gatherings with Friends and Family:   . Attends Religious Services:   . Active Member of Clubs or Organizations:   . Attends Archivist Meetings:   Marland Kitchen Marital Status:   Intimate Partner Violence:   . Fear of Current or Ex-Partner:   . Emotionally Abused:   Marland Kitchen Physically Abused:   . Sexually Abused:     FAMILY HISTORY: Family History  Problem Relation Age of Onset  . Prostate cancer Paternal Grandfather   . Asthma Father   . Stroke Father   . Hyperlipidemia Father   . Diabetes Mother   . Thyroid disease Mother   . Breast cancer Neg Hx     ALLERGIES:  is allergic to sulfa antibiotics and clarithromycin.  MEDICATIONS:  Current Outpatient Medications  Medication Sig Dispense Refill  . albuterol (VENTOLIN HFA) 108 (90 Base) MCG/ACT inhaler Inhale 2 puffs into the lungs every 6 (six) hours as needed for wheezing or shortness of breath.     . cetirizine (ZYRTEC) 10 MG tablet  Take 10 mg by mouth daily.    . Cholecalciferol 25 MCG (1000 UT) capsule Take 1,000 Units by mouth daily.     . cyanocobalamin (,VITAMIN B-12,) 1000 MCG/ML injection Inject 1,000 mcg into the muscle every 30 (thirty) days.    Marland Kitchen docusate sodium (COLACE) 100 MG capsule Take 100 mg by mouth daily.    Marland Kitchen etanercept (ENBREL SURECLICK) 50 MG/ML injection Inject 50 mg into the skin every 14 (fourteen) days.     . ferrous sulfate 325 (65 FE) MG tablet Take 325 mg by mouth daily.     . fluticasone (FLONASE) 50 MCG/ACT nasal spray Place 2 sprays into both nostrils daily as needed for allergies.     . Glucose Blood (COOL BLOOD GLUCOSE TEST STRIPS VI) Use 3 (three) times daily Use as instructed. Freestyle Lite Test Strips E11.29    . levothyroxine (SYNTHROID) 200 MCG tablet Take 200 mcg by mouth daily before breakfast.     . losartan-hydrochlorothiazide (  HYZAAR) 50-12.5 MG tablet Take 1 tablet by mouth daily.    . Multiple Vitamin (MULTIVITAMIN) capsule Take 1 capsule by mouth daily.    . Semaglutide, 1 MG/DOSE, (OZEMPIC, 1 MG/DOSE,) 2 MG/1.5ML SOPN Inject 1 mg as directed once a week.    . tamoxifen (NOLVADEX) 20 MG tablet Take 1 tablet (20 mg total) by mouth daily. Start AFTER MRI is done. 60 tablet 1  . phentermine (ADIPEX-P) 37.5 MG tablet Take 37.5 mg by mouth daily before breakfast.      No current facility-administered medications for this visit.      Marland Kitchen  PHYSICAL EXAMINATION: ECOG PERFORMANCE STATUS: 0 - Asymptomatic  Vitals:   04/02/20 1038  BP: 119/82  Pulse: (!) 101  Resp: 16  Temp: (!) 97.1 F (36.2 C)  SpO2: 100%   Filed Weights   04/02/20 1038  Weight: 241 lb 3.2 oz (109.4 kg)    Physical Exam Constitutional:      Comments: Patient is alone.  HENT:     Head: Normocephalic and atraumatic.     Mouth/Throat:     Pharynx: No oropharyngeal exudate.  Eyes:     Pupils: Pupils are equal, round, and reactive to light.  Cardiovascular:     Rate and Rhythm: Normal rate and  regular rhythm.  Pulmonary:     Effort: Pulmonary effort is normal. No respiratory distress.     Breath sounds: Normal breath sounds. No wheezing.  Abdominal:     General: Bowel sounds are normal. There is no distension.     Palpations: Abdomen is soft. There is no mass.     Tenderness: There is no abdominal tenderness. There is no guarding or rebound.  Musculoskeletal:        General: No tenderness. Normal range of motion.     Cervical back: Normal range of motion and neck supple.  Skin:    General: Skin is warm.  Neurological:     Mental Status: She is alert and oriented to person, place, and time.  Psychiatric:        Mood and Affect: Affect normal.    LABORATORY DATA:  I have reviewed the data as listed Lab Results  Component Value Date   WBC 8.6 09/22/2019   HGB 14.5 09/22/2019   HCT 43.7 09/22/2019   MCV 93.4 09/22/2019   PLT 308 09/22/2019   Recent Labs    09/22/19 1220  NA 136  K 3.3*  CL 100  CO2 25  GLUCOSE 193*  BUN 9  CREATININE 0.77  CALCIUM 9.1  GFRNONAA >60  GFRAA >60  PROT 8.3*  ALBUMIN 4.5  AST 37  ALT 38  ALKPHOS 88  BILITOT 0.9    RADIOGRAPHIC STUDIES: I have personally reviewed the radiological images as listed and agreed with the findings in the report. No results found.  ASSESSMENT & PLAN:   Carcinoma of upper-outer quadrant of right breast in female, estrogen receptor positive ([place]) #Right breast cancer lobular-stage I grade 2-ER/PR positive HER-2 negative.  Currently s/p radiation.  Stable.  Currently on tamoxifen. Tolerating well.   # Insomnia- recommend melatonin nightly as needed.  # DISPOSITION: # follow up in 4 months-MD- no labs- Dr.B   All questions were answered. The patient/family knows to call the clinic with any problems, questions or concerns.     Cammie Sickle, MD 04/02/2020 2:40 PM

## 2020-04-13 ENCOUNTER — Other Ambulatory Visit: Payer: Self-pay | Admitting: Internal Medicine

## 2020-06-11 ENCOUNTER — Other Ambulatory Visit: Payer: Self-pay | Admitting: Orthopedic Surgery

## 2020-06-11 DIAGNOSIS — M25561 Pain in right knee: Secondary | ICD-10-CM

## 2020-06-13 ENCOUNTER — Ambulatory Visit
Admission: RE | Admit: 2020-06-13 | Discharge: 2020-06-13 | Disposition: A | Discharge status: Home / Self Care | Payer: Managed Care, Other (non HMO) | Source: Ambulatory Visit | Attending: Orthopedic Surgery | Admitting: Orthopedic Surgery

## 2020-06-13 ENCOUNTER — Other Ambulatory Visit: Payer: Self-pay

## 2020-06-13 DIAGNOSIS — M25561 Pain in right knee: Secondary | ICD-10-CM | POA: Insufficient documentation

## 2020-07-02 ENCOUNTER — Other Ambulatory Visit: Payer: Self-pay

## 2020-07-02 ENCOUNTER — Inpatient Hospital Stay: Payer: Managed Care, Other (non HMO) | Attending: Internal Medicine | Admitting: Internal Medicine

## 2020-07-02 DIAGNOSIS — Z833 Family history of diabetes mellitus: Secondary | ICD-10-CM | POA: Insufficient documentation

## 2020-07-02 DIAGNOSIS — E663 Overweight: Secondary | ICD-10-CM | POA: Insufficient documentation

## 2020-07-02 DIAGNOSIS — F17210 Nicotine dependence, cigarettes, uncomplicated: Secondary | ICD-10-CM | POA: Diagnosis not present

## 2020-07-02 DIAGNOSIS — C50411 Malignant neoplasm of upper-outer quadrant of right female breast: Secondary | ICD-10-CM | POA: Insufficient documentation

## 2020-07-02 DIAGNOSIS — I10 Essential (primary) hypertension: Secondary | ICD-10-CM | POA: Insufficient documentation

## 2020-07-02 DIAGNOSIS — Z8349 Family history of other endocrine, nutritional and metabolic diseases: Secondary | ICD-10-CM | POA: Insufficient documentation

## 2020-07-02 DIAGNOSIS — E119 Type 2 diabetes mellitus without complications: Secondary | ICD-10-CM | POA: Insufficient documentation

## 2020-07-02 DIAGNOSIS — E781 Pure hyperglyceridemia: Secondary | ICD-10-CM | POA: Diagnosis not present

## 2020-07-02 DIAGNOSIS — Z79899 Other long term (current) drug therapy: Secondary | ICD-10-CM | POA: Insufficient documentation

## 2020-07-02 DIAGNOSIS — J45909 Unspecified asthma, uncomplicated: Secondary | ICD-10-CM | POA: Diagnosis not present

## 2020-07-02 DIAGNOSIS — Z170 Estrogen receptor positive status [ER+]: Secondary | ICD-10-CM | POA: Diagnosis not present

## 2020-07-02 DIAGNOSIS — Z9049 Acquired absence of other specified parts of digestive tract: Secondary | ICD-10-CM | POA: Insufficient documentation

## 2020-07-02 DIAGNOSIS — Z79810 Long term (current) use of selective estrogen receptor modulators (SERMs): Secondary | ICD-10-CM | POA: Insufficient documentation

## 2020-07-02 DIAGNOSIS — Z8042 Family history of malignant neoplasm of prostate: Secondary | ICD-10-CM | POA: Insufficient documentation

## 2020-07-02 MED ORDER — TAMOXIFEN CITRATE 20 MG PO TABS
20.0000 mg | ORAL_TABLET | Freq: Every day | ORAL | 1 refills | Status: DC
Start: 2020-07-02 — End: 2020-12-30

## 2020-07-02 NOTE — Progress Notes (Signed)
[facility] [place] NOTE  Patient Care Team: Adin Hector, MD as PCP - General (Internal Medicine) Rico Junker, RN as Registered Nurse Bary Castilla, Forest Gleason, MD as Consulting Physician (General Surgery) Noreene Filbert, MD as Referring Physician (Radiation Oncology) Cammie Sickle, MD as Consulting Physician (Internal Medicine)  CHIEF COMPLAINTS/PURPOSE OF CONSULTATION: Breast cancer  #  Oncology History Overview Note  # JAN 2021- INVASIVE LOBULAR CARCINOMA, TUBULOLOBULAR PATTERN s/p Lumpectomy; re-excision [Dr.Byrnett]; G-2 ER/PR> 90%; HER-2 negative;  pT1c pN0 (sn); Oncotype RS- 17; NO chemo. S/p RT -mammosite  # April 20/2021- START TAMOXIFEN  # DM; Psoriatic arthritis [Enbrel;Dr.[facility]le];   # SURVIVORSHIP: pending  DIAGNOSIS: Breast cancer  STAGE:  I       ;  GOALS: cure  CURRENT/MOST RECENT THERAPY : Tamoxifen        Carcinoma of upper-outer quadrant of right breast in female, estrogen receptor positive ([place])  09/22/2019 Initial Diagnosis   Carcinoma of upper-outer quadrant of right breast in female, estrogen receptor positive ([place])      HISTORY OF PRESENTING ILLNESS:  Kaitlyn Potter 49 y.o.  female perimenopausal stage I lobular cancer ER/PR positive HER-2 negative on  Tamoxifen.  Patient recently lost her father to intracranial hemorrhage post fall.  She is coping up fairly well.  Patient denies any new onset of mood swings or joint pains or hot flashes.  She continues to be compliant with her tamoxifen.  Patient states she has been try to lose weight.  She hurt her knee had MRI had steroid injections improving.   Review of Systems  Constitutional: Negative for chills, diaphoresis, fever, malaise/fatigue and weight loss.  HENT: Negative for nosebleeds and sore throat.   Eyes: Negative for double vision.  Respiratory: Negative for cough, hemoptysis, sputum production, shortness of breath and wheezing.   Cardiovascular:  Negative for chest pain, palpitations, orthopnea and leg swelling.  Gastrointestinal: Negative for abdominal pain, blood in stool, constipation, diarrhea, heartburn, melena, nausea and vomiting.  Genitourinary: Negative for dysuria, frequency and urgency.  Musculoskeletal: Positive for joint pain. Negative for back pain.  Skin: Negative.  Negative for itching and rash.  Neurological: Negative for dizziness, tingling, focal weakness, weakness and headaches.  Endo/Heme/Allergies: Does not bruise/bleed easily.  Psychiatric/Behavioral: Negative for depression. The patient has insomnia. The patient is not nervous/anxious.      MEDICAL HISTORY:  Past Medical History:  Diagnosis Date  . Acquired hypothyroidism   . Arthritis    psoriatic  . Asthma   . Carcinoma of upper-outer quadrant of right breast in female, estrogen receptor positive ([place])   . Heart murmur   . Hypertension   . Hypertriglyceridemia   . Psoriasis   . Type 2 diabetes mellitus with microalbuminuria, without long-term current use of insulin ([place])     SURGICAL HISTORY: Past Surgical History:  Procedure Laterality Date  . BREAST BIOPSY Right 09/08/2016   affrim stereo for calcs attempted but area to close to skin  . BREAST BIOPSY Right   . BREAST LUMPECTOMY WITH NEEDLE LOCALIZATION Right 10/27/2019   Procedure: BREAST LUMPECTOMY WITH NEEDLE LOCALIZATION;  Surgeon: Robert Bellow, MD;  Location: [facility] ORS;  Service: General;  Laterality: Right;  and SLN  . LAPAROSCOPIC CHOLECYSTECTOMY    . lasix eye surgery    . RE-EXCISION OF BREAST CANCER,SUPERIOR MARGINS Right 11/06/2019   Procedure: RE-EXCISION OF BREAST CANCER,SUPERIOR MARGINS;  Surgeon: Robert Bellow, MD;  Location: [facility] ORS;  Service: General;  Laterality: Right;  .  WISDOM TOOTH EXTRACTION      SOCIAL HISTORY: Social History   Socioeconomic History  . Marital status: Single    Spouse name: Not on file  . Number of children: Not on file  . Years of  education: Not on file  . Highest education level: Not on file  Occupational History  . Not on file  Tobacco Use  . Smoking status: Current Some Day Smoker  . Smokeless tobacco: Never Used  . Tobacco comment: rarely  Vaping Use  . Vaping Use: Never used  Substance and Sexual Activity  . Alcohol use: Yes    Alcohol/week: 1.0 - 2.0 standard drink    Types: 1 - 2 Glasses of wine per week    Comment: rarely  . Drug use: Never  . Sexual activity: Not on file  Other Topics Concern  . 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    Social Determinants of Health   Financial Resource Strain:   . Difficulty of Paying Living Expenses: Not on file  Food Insecurity:   . Worried About Charity fundraiser in the Last Year: Not on file  . Ran Out of Food in the Last Year: Not on file  Transportation Needs:   . Lack of Transportation (Medical): Not on file  . Lack of Transportation (Non-Medical): Not on file  Physical Activity:   . Days of Exercise per Week: Not on file  . Minutes of Exercise per Session: Not on file  Stress:   . Feeling of Stress : Not on file  Social Connections:   . Frequency of Communication with Friends and Family: Not on file  . Frequency of Social Gatherings with Friends and Family: Not on file  . Attends Religious Services: Not on file  . Active Member of Clubs or Organizations: Not on file  . Attends Archivist Meetings: Not on file  . Marital Status: Not on file  Intimate Partner Violence:   . Fear of Current or Ex-Partner: Not on file  . Emotionally Abused: Not on file  . Physically Abused: Not on file  . Sexually Abused: Not on file    FAMILY HISTORY: Family History  Problem Relation Age of Onset  . Prostate cancer Paternal Grandfather   . Asthma Father   . Stroke Father   . Hyperlipidemia Father   . Diabetes Mother   .  Thyroid disease Mother   . Breast cancer Neg Hx     ALLERGIES:  is allergic to sulfa antibiotics and clarithromycin.  MEDICATIONS:  Current Outpatient Medications  Medication Sig Dispense Refill  . albuterol (VENTOLIN HFA) 108 (90 Base) MCG/ACT inhaler Inhale 2 puffs into the lungs every 6 (six) hours as needed for wheezing or shortness of breath.     . cetirizine (ZYRTEC) 10 MG tablet Take 10 mg by mouth daily.    . Cholecalciferol 25 MCG (1000 UT) capsule Take 1,000 Units by mouth daily.     . cyanocobalamin (,VITAMIN B-12,) 1000 MCG/ML injection Inject 1,000 mcg into the muscle every 30 (thirty) days.    Marland Kitchen docusate sodium (COLACE) 100 MG capsule Take 100 mg by mouth daily.    Marland Kitchen etanercept (ENBREL SURECLICK) 50 MG/ML injection Inject 50 mg into the skin every 14 (fourteen) days.     . fluticasone (FLONASE) 50 MCG/ACT nasal spray Place 2 sprays into both nostrils  daily as needed for allergies.     . Glucose Blood (COOL BLOOD GLUCOSE TEST STRIPS VI) Use 3 (three) times daily Use as instructed. Freestyle Lite Test Strips E11.29    . levothyroxine (SYNTHROID) 175 MCG tablet Take 175 mcg by mouth every other day. Alternates with 274mg dosing every other day    . levothyroxine (SYNTHROID) 200 MCG tablet Take 200 mcg by mouth daily before breakfast. Alternates every other day with 1731m dosing    . losartan-hydrochlorothiazide (HYZAAR) 50-12.5 MG tablet Take 1 tablet by mouth daily.    . Multiple Vitamin (MULTIVITAMIN) capsule Take 1 capsule by mouth daily.    . phentermine (ADIPEX-P) 37.5 MG tablet Take 37.5 mg by mouth daily before breakfast.     . Semaglutide, 1 MG/DOSE, (OZEMPIC, 1 MG/DOSE,) 2 MG/1.5ML SOPN Inject 1 mg as directed once a week.    . ferrous sulfate 325 (65 FE) MG tablet Take 325 mg by mouth daily.  (Patient not taking: Reported on 07/02/2020)    . Phendimetrazine Tartrate 35 MG TABS Take 35 mg by mouth daily.    . tamoxifen (NOLVADEX) 20 MG tablet Take 1 tablet (20 mg total)  by mouth daily. 90 tablet 1   No current facility-administered medications for this visit.      . Marland KitchenPHYSICAL EXAMINATION: ECOG PERFORMANCE STATUS: 0 - Asymptomatic  Vitals:   07/02/20 1020  BP: 129/88  Pulse: (!) 105  Resp: 18  Temp: 98.2 F (36.8 C)  SpO2: 100%   Filed Weights   07/02/20 1020  Weight: 236 lb (107 kg)    Physical Exam Constitutional:      Comments: Patient is alone.  HENT:     Head: Normocephalic and atraumatic.     Mouth/Throat:     Pharynx: No oropharyngeal exudate.  Eyes:     Pupils: Pupils are equal, round, and reactive to light.  Cardiovascular:     Rate and Rhythm: Normal rate and regular rhythm.  Pulmonary:     Effort: Pulmonary effort is normal. No respiratory distress.     Breath sounds: Normal breath sounds. No wheezing.  Abdominal:     General: Bowel sounds are normal. There is no distension.     Palpations: Abdomen is soft. There is no mass.     Tenderness: There is no abdominal tenderness. There is no guarding or rebound.  Musculoskeletal:        General: No tenderness. Normal range of motion.     Cervical back: Normal range of motion and neck supple.  Skin:    General: Skin is warm.  Neurological:     Mental Status: She is alert and oriented to person, place, and time.  Psychiatric:        Mood and Affect: Affect normal.    LABORATORY DATA:  I have reviewed the data as listed Lab Results  Component Value Date   WBC 8.6 09/22/2019   HGB 14.5 09/22/2019   HCT 43.7 09/22/2019   MCV 93.4 09/22/2019   PLT 308 09/22/2019   Recent Labs    09/22/19 1220  NA 136  K 3.3*  CL 100  CO2 25  GLUCOSE 193*  BUN 9  CREATININE 0.77  CALCIUM 9.1  GFRNONAA >60  GFRAA >60  PROT 8.3*  ALBUMIN 4.5  AST 37  ALT 38  ALKPHOS 88  BILITOT 0.9    RADIOGRAPHIC STUDIES: I have personally reviewed the radiological images as listed and agreed with the findings in the report. MR  KNEE RIGHT WO CONTRAST  Result Date:  06/14/2020 CLINICAL DATA:  Right knee pain after beginning and exercise program. Pain for approximately 2 months. EXAM: MRI OF THE RIGHT KNEE WITHOUT CONTRAST TECHNIQUE: Multiplanar, multisequence MR imaging of the knee was performed. No intravenous contrast was administered. COMPARISON:  None. FINDINGS: MENISCI Medial meniscus: Intrasubstance degenerative type signal changes but no meniscal tear. Lateral meniscus:  Intact LIGAMENTS Cruciates:  Intact Collaterals:  Intact CARTILAGE Patellofemoral: Age advanced degenerative chondrosis with cartilage thinning and fissuring mainly along the patellar apex and lateral facet. There is early joint space narrowing. There is also a small subchondral fracture involving the lateral patella with moderate surrounding marrow edema. Medial: Moderate degenerative chondrosis with early joint space narrowing and spurring. Lateral:  Mild degenerative chondrosis with early spurring changes. Joint: Moderate-sized joint effusion and mild synovitis. Medial patellar plica is noted. Mild deep infrapatellar bursitis is noted. There is also inflammation and edema in Hoffa's fat. Popliteal Fossa: No popliteal mass or Baker's cyst. There is fluid tracking back along the popliteus tendon and surrounding the popliteus muscle. Extensor Mechanism: The patella retinacular structures are intact and the quadriceps and patellar tendons are intact. Slight lateral tilt and orientation of the patella in relation to the femoral trochlear groove may be related to the patient's body habitus. The TT-TG distance is 12 mm. Bones: Small subchondral fracture involving the lateral patella. No other acute bony findings are identified. Other: Unremarkable knee musculature. IMPRESSION: 1. 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 2. Small subchondral fracture involving the lateral patella. 3. Intact ligamentous structures and no meniscal tears. 4.  Moderate-sized joint effusion and mild synovitis. Medial patellar plica is noted. 5. Mild deep infrapatellar bursitis. Electronically Signed   By: Marijo Sanes M.D.   On: 06/14/2020 08:49    ASSESSMENT & PLAN:   Carcinoma of upper-outer quadrant of right breast in female, estrogen receptor positive ([place]) #Right breast cancer lobular-stage I grade 2-ER/PR positive HER-2 negative;Currently on tamoxifen. Tolerating well. STABLE.  Defer to Dr. Nani Gasser in December for follow-up MRI.  # Overweight-discussed importance of weight loss/exercise.  Commended the patient on try to lose weight.  # Insomnia- on melatonin prn.   # DISPOSITION: # follow up in 6 months-MD- no labs- Dr.B   All questions were answered. The patient/family knows to call the clinic with any problems, questions or concerns.     Cammie Sickle, MD 07/02/2020 10:47 AM

## 2020-07-02 NOTE — Assessment & Plan Note (Addendum)
#  Right breast cancer lobular-stage I grade 2-ER/PR positive HER-2 negative;Currently on tamoxifen. Tolerating well. STABLE.  Defer to Dr. Nani Gasser in December for follow-up MRI.  # Overweight-discussed importance of weight loss/exercise.  Commended the patient on try to lose weight.  # Insomnia- on melatonin prn.   # DISPOSITION: # follow up in 6 months-MD- no labs- Dr.B

## 2020-07-11 ENCOUNTER — Ambulatory Visit: Payer: Managed Care, Other (non HMO) | Admitting: Radiation Oncology

## 2020-07-16 ENCOUNTER — Other Ambulatory Visit: Payer: Self-pay | Admitting: General Surgery

## 2020-07-16 DIAGNOSIS — C50911 Malignant neoplasm of unspecified site of right female breast: Secondary | ICD-10-CM

## 2020-07-17 ENCOUNTER — Encounter: Payer: Self-pay | Admitting: Radiation Oncology

## 2020-07-17 ENCOUNTER — Other Ambulatory Visit: Payer: Self-pay

## 2020-07-17 ENCOUNTER — Ambulatory Visit
Admission: RE | Admit: 2020-07-17 | Discharge: 2020-07-17 | Disposition: A | Discharge status: Home / Self Care | Payer: Managed Care, Other (non HMO) | Source: Ambulatory Visit | Attending: Radiation Oncology | Admitting: Radiation Oncology

## 2020-07-17 VITALS — BP 135/85 | HR 108 | Temp 97.7°F | Wt 242.0 lb

## 2020-07-17 DIAGNOSIS — C50411 Malignant neoplasm of upper-outer quadrant of right female breast: Secondary | ICD-10-CM

## 2020-07-17 DIAGNOSIS — Z170 Estrogen receptor positive status [ER+]: Secondary | ICD-10-CM

## 2020-07-17 NOTE — Progress Notes (Signed)
Survivorship Care Plan visit completed.  Treatment summary reviewed and given to patient.  ASCO answers booklet reviewed and given to patient.  CARE program and Cancer Transitions discussed with patient along with other resources cancer center offers to patients and caregivers.  Patient verbalized understanding.    Patient in agreement for APP to have a Virtual visit to introduce them to the Survivorship Clinic.  Encouraged patient to call for any questions or concerns.

## 2020-07-17 NOTE — Progress Notes (Signed)
Radiation Oncology Follow up Note  Name: Kaitlyn Potter   Date:   07/17/2020 MRN:  [id number] DOB: 02/03/1971    This 49 y.o. female presents to the clinic today for 67-monthfollow-up status post accelerated partial breast radiation to her right breast for stage Ic invasive mammary carcinoma.  REFERRING PROVIDER: KAdin Hector MD  HPI: Patient is a 49year old female now out 6 months having completed accelerated partial breast radiation to her right breast for stage T1CN0 ER/PR positive invasive mammary carcinoma with lobular features.  Seen today in routine follow-up she is doing well.  She specifically denies breast tenderness cough or bone pain..  She is not yet had follow-up mammograms.  She is currently on tamoxifen tolerating it well without side effect.  COMPLICATIONS OF TREATMENT: none  FOLLOW UP COMPLIANCE: keeps appointments   PHYSICAL EXAM:  BP 135/85   Pulse (!) 108   Temp 97.7 F (36.5 C) (Tympanic)   Wt 242 lb (109.8 kg)   BMI 37.90 kg/m  Lungs are clear to A&P cardiac examination essentially unremarkable with regular rate and rhythm. No dominant mass or nodularity is noted in either breast in 2 positions examined. Incision is well-healed. No axillary or supraclavicular adenopathy is appreciated. Cosmetic result is excellent.  Well-developed well-nourished patient in NAD. HEENT reveals PERLA, EOMI, discs not visualized.  Oral cavity is clear. No oral mucosal lesions are identified. Neck is clear without evidence of cervical or supraclavicular adenopathy. Lungs are clear to A&P. Cardiac examination is essentially unremarkable with regular rate and rhythm without murmur rub or thrill. Abdomen is benign with no organomegaly or masses noted. Motor sensory and DTR levels are equal and symmetric in the upper and lower extremities. Cranial nerves II through XII are grossly intact. Proprioception is intact. No peripheral adenopathy or edema is identified. No motor or sensory  levels are noted. Crude visual fields are within normal range.  RADIOLOGY RESULTS: No current films to review  PLAN: Present time patient is doing well with no evidence of disease 6 months out from accelerated partial breast radiation.  She continues on tamoxifen without side effect.  She will have a follow-up mammogram ordered by Dr. BTollie Pizzain the near future.  I have asked to see her back in 1 year for follow-up.  Patient knows to call with any concerns.  I would like to take this opportunity to thank you for allowing me to participate in the care of your patient..Noreene Filbert MD

## 2020-07-18 ENCOUNTER — Inpatient Hospital Stay: Payer: Managed Care, Other (non HMO) | Attending: Nurse Practitioner | Admitting: Nurse Practitioner

## 2020-08-14 NOTE — Progress Notes (Signed)
Patient did not answer for video visit.

## 2020-08-28 ENCOUNTER — Ambulatory Visit
Admission: RE | Admit: 2020-08-28 | Discharge: 2020-08-28 | Disposition: A | Discharge status: Home / Self Care | Payer: Managed Care, Other (non HMO) | Source: Ambulatory Visit | Attending: General Surgery | Admitting: General Surgery

## 2020-08-28 ENCOUNTER — Other Ambulatory Visit: Payer: Self-pay

## 2020-08-28 DIAGNOSIS — C50911 Malignant neoplasm of unspecified site of right female breast: Secondary | ICD-10-CM

## 2020-08-28 HISTORY — DX: Malignant neoplasm of unspecified site of unspecified female breast: C50.919

## 2020-12-28 ENCOUNTER — Other Ambulatory Visit: Payer: Self-pay | Admitting: Internal Medicine

## 2020-12-31 ENCOUNTER — Inpatient Hospital Stay: Payer: Managed Care, Other (non HMO) | Attending: Internal Medicine | Admitting: Internal Medicine

## 2020-12-31 ENCOUNTER — Other Ambulatory Visit: Payer: Self-pay

## 2020-12-31 ENCOUNTER — Encounter: Payer: Self-pay | Admitting: Internal Medicine

## 2020-12-31 DIAGNOSIS — Z79810 Long term (current) use of selective estrogen receptor modulators (SERMs): Secondary | ICD-10-CM | POA: Insufficient documentation

## 2020-12-31 DIAGNOSIS — Z170 Estrogen receptor positive status [ER+]: Secondary | ICD-10-CM | POA: Diagnosis not present

## 2020-12-31 DIAGNOSIS — C50411 Malignant neoplasm of upper-outer quadrant of right female breast: Secondary | ICD-10-CM | POA: Insufficient documentation

## 2020-12-31 NOTE — Progress Notes (Signed)
[facility] NOTE  Patient Care Team: Adin Hector, MD as PCP - General (Internal Medicine) Rico Junker, RN as Registered Nurse Bary Castilla, Forest Gleason, MD as Consulting Physician (General Surgery) Noreene Filbert, MD as Referring Physician (Radiation Oncology) Cammie Sickle, MD as Consulting Physician (Internal Medicine)  CHIEF COMPLAINTS/PURPOSE OF CONSULTATION: Breast cancer  #  Oncology History Overview Note  # JAN 2021- INVASIVE LOBULAR CARCINOMA, TUBULOLOBULAR PATTERN s/p Lumpectomy; re-excision [Dr.Byrnett]; G-2 ER/PR> 90%; HER-2 negative;  pT1c pN0 (sn); Oncotype RS- 17; NO chemo. S/p RT -mammosite  # April 20/2021- START TAMOXIFEN  # DM; Psoriatic arthritis [Enbrel;Dr.Kernodle];   # SURVIVORSHIP: pending  DIAGNOSIS: Breast cancer  STAGE:  I       ;  GOALS: cure  CURRENT/MOST RECENT THERAPY : Tamoxifen        Carcinoma of upper-outer quadrant of right breast in female, estrogen receptor positive ([place])  09/22/2019 Initial Diagnosis   Carcinoma of upper-outer quadrant of right breast in female, estrogen receptor positive ([place])      HISTORY OF PRESENTING ILLNESS:  Kaitlyn Potter 50 y.o.  female perimenopausal stage I lobular cancer ER/PR positive HER-2 negative on  Tamoxifen.  Patient continues to complain of difficulty sleeping at night.  Patient has had tried melatonin; also tried trazodone.  Currently not taking any sleeping aids because of intolerance/side effects.  Denies any abnormal vaginal bleeding.  She continues to follow-up closely with Dr. Ouida Sills given history of obesity/phentermine.  Review of Systems  Constitutional: Negative for chills, diaphoresis, fever, malaise/fatigue and weight loss.  HENT: Negative for nosebleeds and sore throat.   Eyes: Negative for double vision.  Respiratory: Negative for cough, hemoptysis, sputum production, shortness of breath and wheezing.   Cardiovascular: Negative for chest  pain, palpitations, orthopnea and leg swelling.  Gastrointestinal: Negative for abdominal pain, blood in stool, constipation, diarrhea, heartburn, melena, nausea and vomiting.  Genitourinary: Negative for dysuria, frequency and urgency.  Musculoskeletal: Positive for joint pain. Negative for back pain.  Skin: Negative.  Negative for itching and rash.  Neurological: Negative for dizziness, tingling, focal weakness, weakness and headaches.  Endo/Heme/Allergies: Does not bruise/bleed easily.  Psychiatric/Behavioral: Negative for depression. The patient has insomnia. The patient is not nervous/anxious.      MEDICAL HISTORY:  Past Medical History:  Diagnosis Date  . Acquired hypothyroidism   . Arthritis    psoriatic  . Asthma   . Breast cancer ([place])   . Carcinoma of upper-outer quadrant of right breast in female, estrogen receptor positive ([place])   . Heart murmur   . Hypertension   . Hypertriglyceridemia   . Personal history of radiation therapy 10/2019   rt breast  . Psoriasis   . Type 2 diabetes mellitus with microalbuminuria, without long-term current use of insulin ([place])     SURGICAL HISTORY: Past Surgical History:  Procedure Laterality Date  . BREAST BIOPSY Right 09/08/2016   affrim stereo for calcs attempted but area to close to skin  . BREAST BIOPSY Right 08/2019   Inv lobular CA with high grade DCIS  . BREAST LUMPECTOMY Right 10/27/2019   Invasive Lobular CA with high grade DCIS + ant margin  . BREAST LUMPECTOMY Right 11/06/2019   re-excision  . BREAST LUMPECTOMY WITH NEEDLE LOCALIZATION Right 10/27/2019   Procedure: BREAST LUMPECTOMY WITH NEEDLE LOCALIZATION;  Surgeon: Robert Bellow, MD;  Location: [facility] ORS;  Service: General;  Laterality: Right;  and SLN  . LAPAROSCOPIC CHOLECYSTECTOMY    .  lasix eye surgery    . RE-EXCISION OF BREAST CANCER,SUPERIOR MARGINS Right 11/06/2019   Procedure: RE-EXCISION OF BREAST CANCER,SUPERIOR MARGINS;  Surgeon: Robert Bellow,  MD;  Location: [facility] ORS;  Service: General;  Laterality: Right;  . WISDOM TOOTH EXTRACTION      SOCIAL HISTORY: Social History   Socioeconomic History  . Marital status: Single    Spouse name: Not on file  . Number of children: Not on file  . Years of education: Not on file  . Highest education level: Not on file  Occupational History  . Not on file  Tobacco Use  . Smoking status: Current Some Day Smoker  . Smokeless tobacco: Never Used  . Tobacco comment: rarely  Vaping Use  . Vaping Use: Never used  Substance and Sexual Activity  . Alcohol use: Yes    Alcohol/week: 1.0 - 2.0 standard drink    Types: 1 - 2 Glasses of wine per week    Comment: rarely  . Drug use: Never  . Sexual activity: Not on file  Other Topics Concern  . 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    Social Determinants of Health   Financial Resource Strain: Not on file  Food Insecurity: Not on file  Transportation Needs: Not on file  Physical Activity: Not on file  Stress: Not on file  Social Connections: Not on file  Intimate Partner Violence: Not on file    FAMILY HISTORY: Family History  Problem Relation Age of Onset  . Prostate cancer Paternal Grandfather   . Asthma Father   . Stroke Father   . Hyperlipidemia Father   . Diabetes Mother   . Thyroid disease Mother   . Breast cancer Neg Hx     ALLERGIES:  is allergic to sulfa antibiotics and clarithromycin.  MEDICATIONS:  Current Outpatient Medications  Medication Sig Dispense Refill  . albuterol (VENTOLIN HFA) 108 (90 Base) MCG/ACT inhaler Inhale 2 puffs into the lungs every 6 (six) hours as needed for wheezing or shortness of breath.     . cetirizine (ZYRTEC) 10 MG tablet Take 10 mg by mouth daily.    . Cholecalciferol 25 MCG (1000 UT) capsule Take 1,000 Units by mouth daily.     . cyanocobalamin (,VITAMIN B-12,)  1000 MCG/ML injection Inject 1,000 mcg into the muscle every 30 (thirty) days.    Marland Kitchen docusate sodium (COLACE) 100 MG capsule Take 100 mg by mouth daily.    Marland Kitchen etanercept (ENBREL SURECLICK) 50 MG/ML injection Inject 50 mg into the skin every 14 (fourteen) days.     . fluticasone (FLONASE) 50 MCG/ACT nasal spray Place 2 sprays into both nostrils daily as needed for allergies.     . Glucose Blood (COOL BLOOD GLUCOSE TEST STRIPS VI) Use 3 (three) times daily Use as instructed. Freestyle Lite Test Strips E11.29    . levothyroxine (SYNTHROID) 100 MCG tablet Take 1 tablet by mouth daily.    Marland Kitchen levothyroxine (SYNTHROID) 88 MCG tablet Take 1 tablet by mouth daily.    Marland Kitchen losartan-hydrochlorothiazide (HYZAAR) 50-12.5 MG tablet Take 1 tablet by mouth daily.    . Multiple Vitamin (MULTIVITAMIN) capsule Take 1 capsule by mouth daily.    . phentermine (ADIPEX-P) 37.5 MG tablet Take 37.5 mg by mouth daily before breakfast.     . Semaglutide, 1 MG/DOSE, (OZEMPIC, 1 MG/DOSE,) 2 MG/1.5ML SOPN Inject 1  mg as directed once a week.    . tamoxifen (NOLVADEX) 20 MG tablet TAKE 1 TABLET BY MOUTH EVERY DAY 90 tablet 1  . traZODone (DESYREL) 50 MG tablet Take 1 tablet by mouth as needed for sleep. (Patient not taking: Reported on 12/31/2020)     No current facility-administered medications for this visit.      Marland Kitchen  PHYSICAL EXAMINATION: ECOG PERFORMANCE STATUS: 0 - Asymptomatic  Vitals:   12/31/20 1001  BP: 136/85  Pulse: (!) 107  Resp: 20  Temp: 97.6 F (36.4 C)   Filed Weights   12/31/20 1001  Weight: 227 lb (103 kg)    Physical Exam Constitutional:      Comments: Patient is alone.  HENT:     Head: Normocephalic and atraumatic.     Mouth/Throat:     Pharynx: No oropharyngeal exudate.  Eyes:     Pupils: Pupils are equal, round, and reactive to light.  Cardiovascular:     Rate and Rhythm: Normal rate and regular rhythm.  Pulmonary:     Effort: Pulmonary effort is normal. No respiratory distress.      Breath sounds: Normal breath sounds. No wheezing.  Abdominal:     General: Bowel sounds are normal. There is no distension.     Palpations: Abdomen is soft. There is no mass.     Tenderness: There is no abdominal tenderness. There is no guarding or rebound.  Musculoskeletal:        General: No tenderness. Normal range of motion.     Cervical back: Normal range of motion and neck supple.  Skin:    General: Skin is warm.  Neurological:     Mental Status: She is alert and oriented to person, place, and time.  Psychiatric:        Mood and Affect: Affect normal.    LABORATORY DATA:  I have reviewed the data as listed Lab Results  Component Value Date   WBC 8.6 09/22/2019   HGB 14.5 09/22/2019   HCT 43.7 09/22/2019   MCV 93.4 09/22/2019   PLT 308 09/22/2019   No results for input(s): NA, K, CL, CO2, GLUCOSE, BUN, CREATININE, CALCIUM, GFRNONAA, GFRAA, PROT, ALBUMIN, AST, ALT, ALKPHOS, BILITOT, BILIDIR, IBILI in the last 8760 hours.  RADIOGRAPHIC STUDIES: I have personally reviewed the radiological images as listed and agreed with the findings in the report. No results found.  ASSESSMENT & PLAN:   Carcinoma of upper-outer quadrant of right breast in female, estrogen receptor positive ([place]) #Right breast cancer lobular-stage I grade 2-ER/PR positive HER-2 negative; Currently on tamoxifen [Dr.schermerhorn]. Tolerating well. STABLE; Dr. Bary Castilla- December 2021- mammo/US- NEG.  # Insomnia-unlikely secondary tamoxifen; possibly from phentermine.  Recommend speaking to Dr. Ouida Sills regarding alternative therapies.  # DISPOSITION: # follow up in 6 months-MD- no labs- Dr.B   All questions were answered. The patient/family knows to call the clinic with any problems, questions or concerns.     Cammie Sickle, MD 12/31/2020 10:47 AM

## 2020-12-31 NOTE — Assessment & Plan Note (Addendum)
#  Right breast cancer lobular-stage I grade 2-ER/PR positive HER-2 negative; Currently on tamoxifen [Dr.schermerhorn]. Tolerating well. STABLE; Dr. Bary Castilla- December 2021- mammo/US- NEG.  # Insomnia-unlikely secondary tamoxifen; possibly from phentermine.  Recommend speaking to Dr. Ouida Sills regarding alternative therapies.  # DISPOSITION: # follow up in 6 months-MD- no labs- Dr.B

## 2020-12-31 NOTE — Progress Notes (Signed)
RN Chaperoned provider with Breast Exam.

## 2021-05-28 IMAGING — MR MR BREAST BX W/ LOC DEV 1ST LEASION IMAGE BX SPEC MR GUIDE*R*
9 of 12 series · 33 of 48 positions shown · IV contrast (gadavist)
Comparison: Previous exams.
COMPARISON: Previous exams.

Addendum:
CLINICAL DATA: 48-year-old female for tissue sampling of UPPER
OUTER RIGHT breast mass. Recent diagnosis of RIGHT breast invasive
lobular carcinoma, which lies 3.5 cm away from the above mass.

EXAM:
MRI GUIDED CORE NEEDLE BIOPSY OF THE RIGHT BREAST
TECHNIQUE: Multiplanar, multisequence MR imaging of the RIGHT breast was
performed both before and after administration of intravenous
contrast.
CONTRAST:  10mL GADAVIST GADOBUTROL 1 MMOL/ML IV SOLN

[Series 2: fiducial unilateral · sagittal · 2.0mm · 1.33mm/px · 3 of 64 slices shown]
[im 1/64]
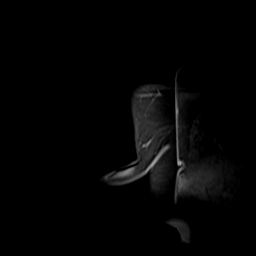
[im 32/64]
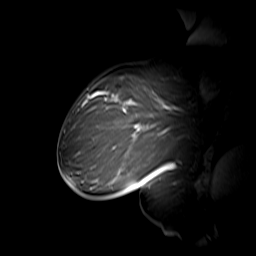
[im 64/64]
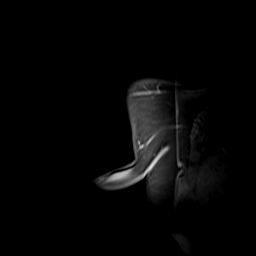

[Series 3: dynamic pre · axial · non-contrast · 1.3mm · 0.73mm/px · z∈[-99,+128]mm · 5 of 176 slices shown]
[im 1/176]
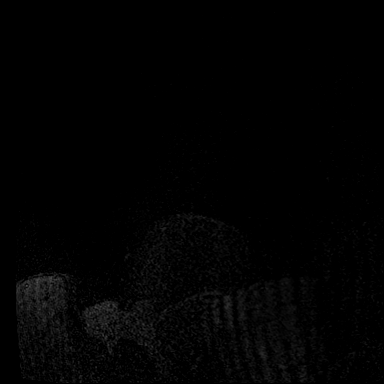
[im 44/176]
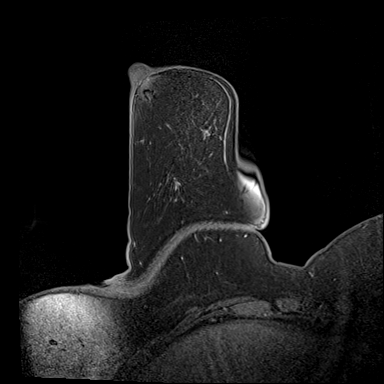
[im 88/176]
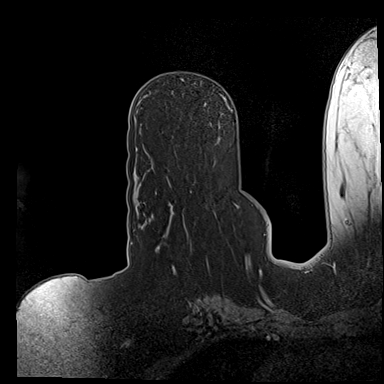
[im 132/176]
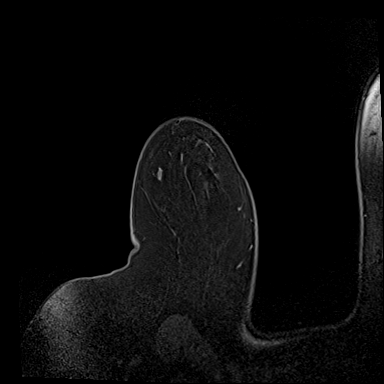
[im 176/176]
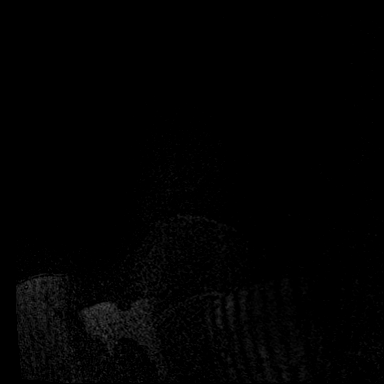

[Series 4: dynamic post 20 · axial · 1.3mm · 0.73mm/px · z∈[-99,+128]mm · 4 of 176 slices shown (1 of 2)]
[im 1/176]
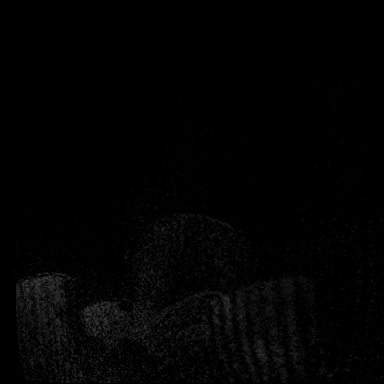
[im 59/176]
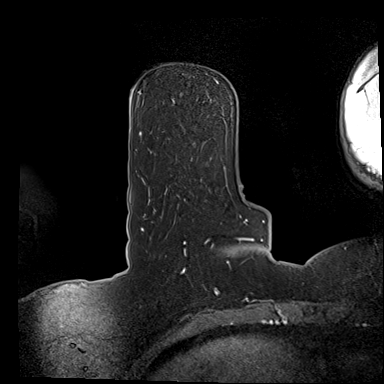
[im 117/176]
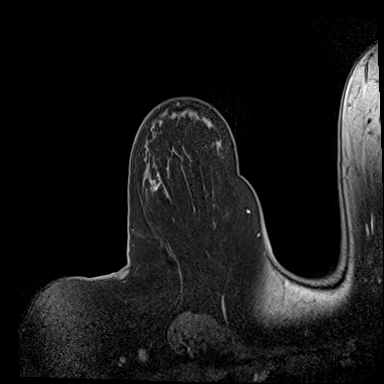
[im 176/176]
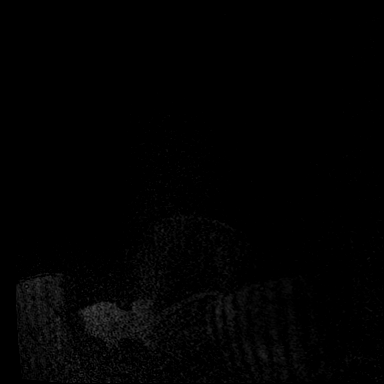

[Series 5: dynamic post 20 · axial · 1.3mm · 0.73mm/px · z∈[-99,+128]mm · 4 of 176 slices shown (2 of 2)]
[im 1/176]
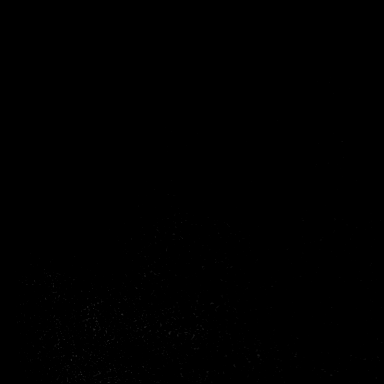
[im 59/176]
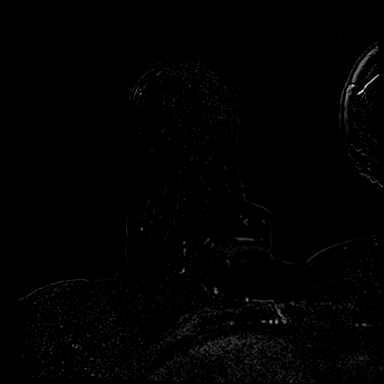
[im 117/176]
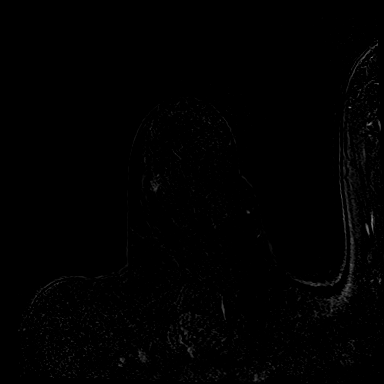
[im 176/176]
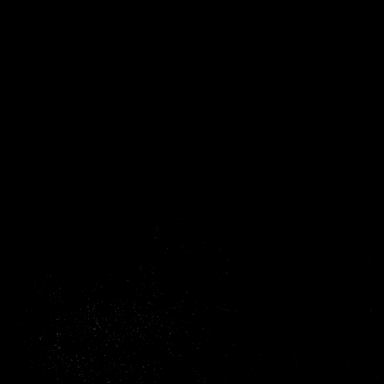

[Series 6: dynamic post 3 · axial · 1.3mm · 0.73mm/px · z∈[-99,+128]mm · 4 of 176 slices shown (1 of 2)]
[im 1/176]
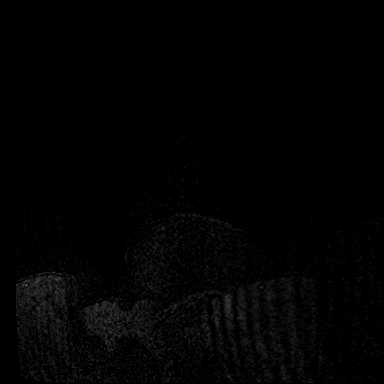
[im 59/176]
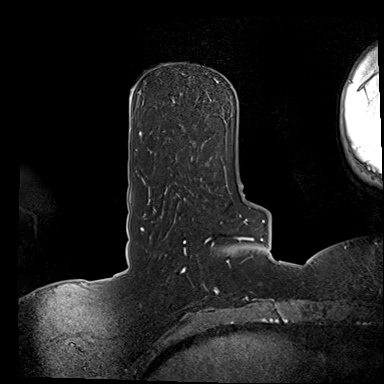
[im 117/176]
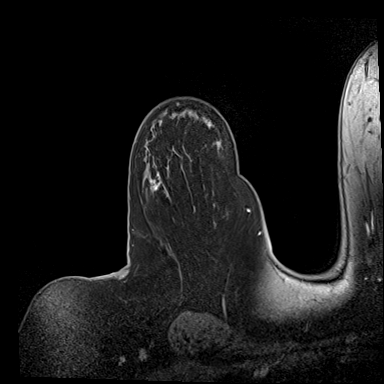
[im 176/176]
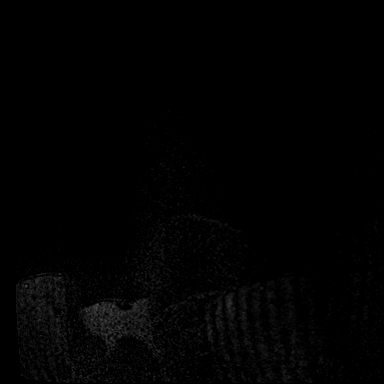

[Series 7: dynamic post 3 · axial · 1.3mm · 0.73mm/px · z∈[-99,+128]mm · 4 of 176 slices shown (2 of 2)]
[im 1/176]
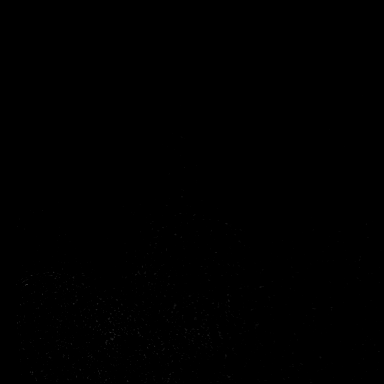
[im 59/176]
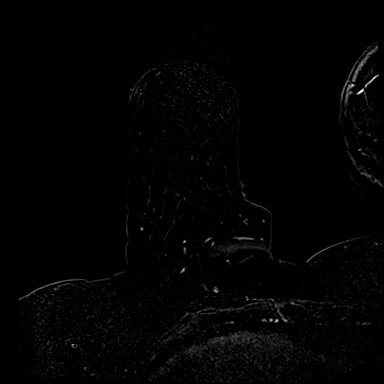
[im 117/176]
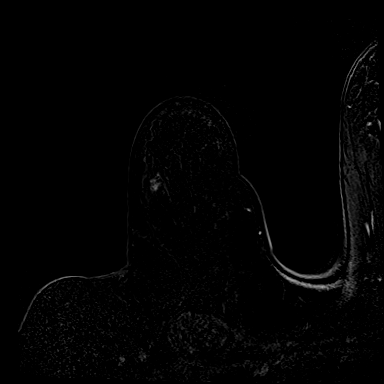
[im 176/176]
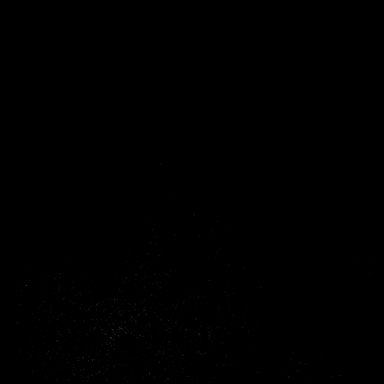

[Series 8: needle confirmation · axial · 1.3mm · 0.73mm/px · z∈[-99,+128]mm · 4 of 176 slices shown]
[im 1/176]
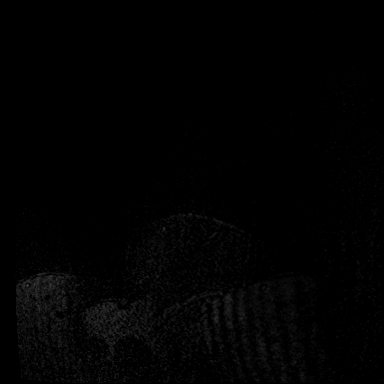
[im 59/176]
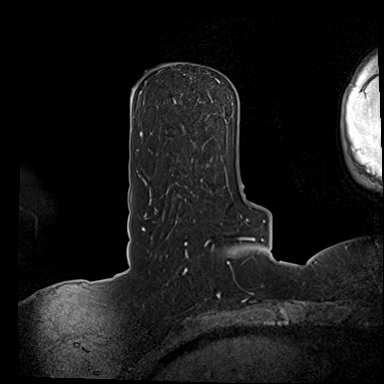
[im 117/176]
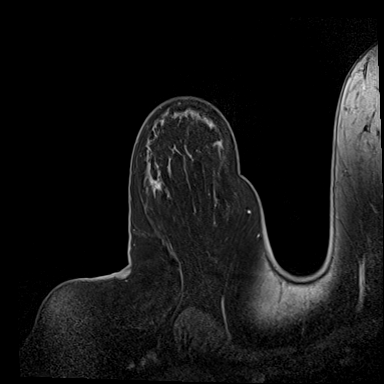
[im 176/176]
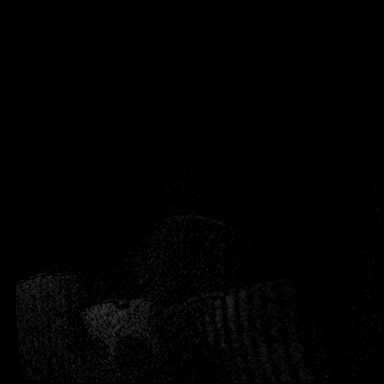

[Series 9: needle confirmation_sub · axial · 1.3mm · 0.73mm/px · z∈[-99,+128]mm · 4 of 176 slices shown]
[im 1/176]
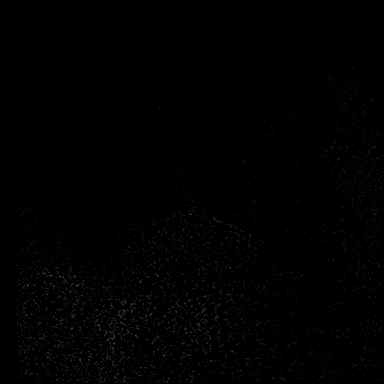
[im 59/176]
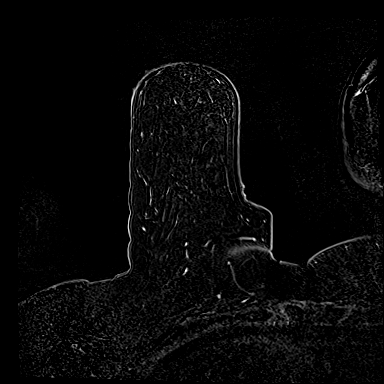
[im 117/176]
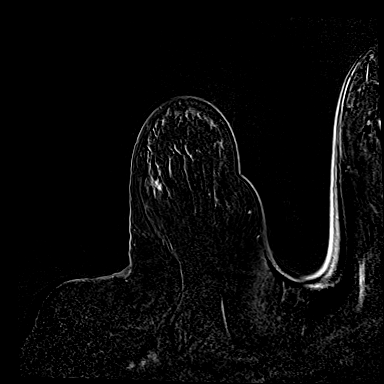
[im 176/176]
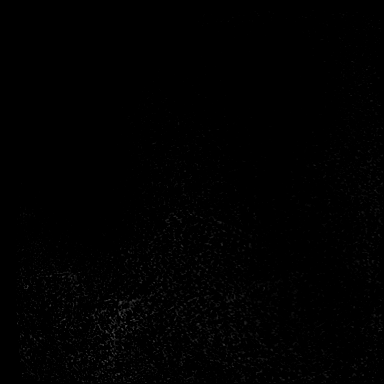

[Series 10: post bx · axial · 1.3mm · 0.73mm/px · 1 of 176 slices shown]
[im 1/176]
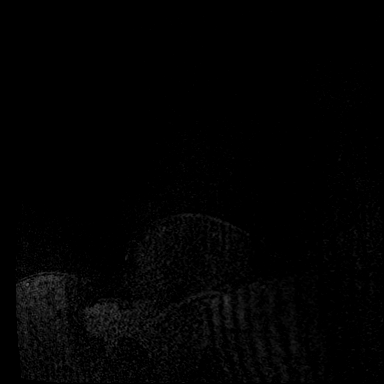

[33 of 48 positions shown; findings below may reference images not displayed]

FINDINGS: I met with the patient, and we discussed the procedure of MRI guided
biopsy, including risks, benefits, and alternatives. Specifically,
we discussed the risks of infection, bleeding, tissue injury, clip
migration, and inadequate sampling. Informed, written consent was
given. The usual time out protocol was performed immediately prior
to the procedure.

Using sterile technique, 1% Lidocaine, MRI guidance, and a 9 gauge
vacuum assisted device, biopsy was performed of the 1.3 cm mass
within the UPPER-OUTER RIGHT breast using a LATERAL approach. At the
conclusion of the procedure, a BARBELL tissue marker clip was
deployed into the biopsy cavity. Follow-up 2-view mammogram was
performed and dictated separately.
IMPRESSION: MRI guided biopsy of 1.3 cm UPPER-OUTER RIGHT breast mass. No
apparent complications.

ADDENDUM:
Pathology revealed FIBROCYSTIC CHANGE, PSEUDOANGIOMATOUS STROMAL
HYPERPLASIA (PASH) of the RIGHT breast, upper outer. This was found
to be concordant by Dr. Dovid Bartolon.

Pathology results were discussed with the patient by telephone. The
patient reported doing well after the biopsy with tenderness at the
site. Post biopsy instructions and care were reviewed and questions
were answered. The patient was encouraged to call The [REDACTED]

The patient has a recent diagnosis of RIGHT breast cancer and should
follow her outlined treatment plan.

Pathology results reported by Mikal Jourdain, RN on 10/16/2019.

*** End of Addendum ***
FINDINGS: I met with the patient, and we discussed the procedure of MRI guided
biopsy, including risks, benefits, and alternatives. Specifically,
we discussed the risks of infection, bleeding, tissue injury, clip
migration, and inadequate sampling. Informed, written consent was
given. The usual time out protocol was performed immediately prior
to the procedure.

Using sterile technique, 1% Lidocaine, MRI guidance, and a 9 gauge
vacuum assisted device, biopsy was performed of the 1.3 cm mass
within the UPPER-OUTER RIGHT breast using a LATERAL approach. At the
conclusion of the procedure, a BARBELL tissue marker clip was
deployed into the biopsy cavity. Follow-up 2-view mammogram was
performed and dictated separately.
IMPRESSION: MRI guided biopsy of 1.3 cm UPPER-OUTER RIGHT breast mass. No
apparent complications.

## 2021-05-28 IMAGING — MG MM BREAST LOCALIZATION CLIP
4 series · 4 of 12 positions shown · non-contrast
Comparison: Previous exam(s).

CLINICAL DATA: Evaluate BARBELL clip following MR guided biopsy of
UPPER-OUTER RIGHT breast mass

EXAM:
3D DIAGNOSTIC RIGHT MAMMOGRAM POST MRI BIOPSY

[R CC synth-2D]
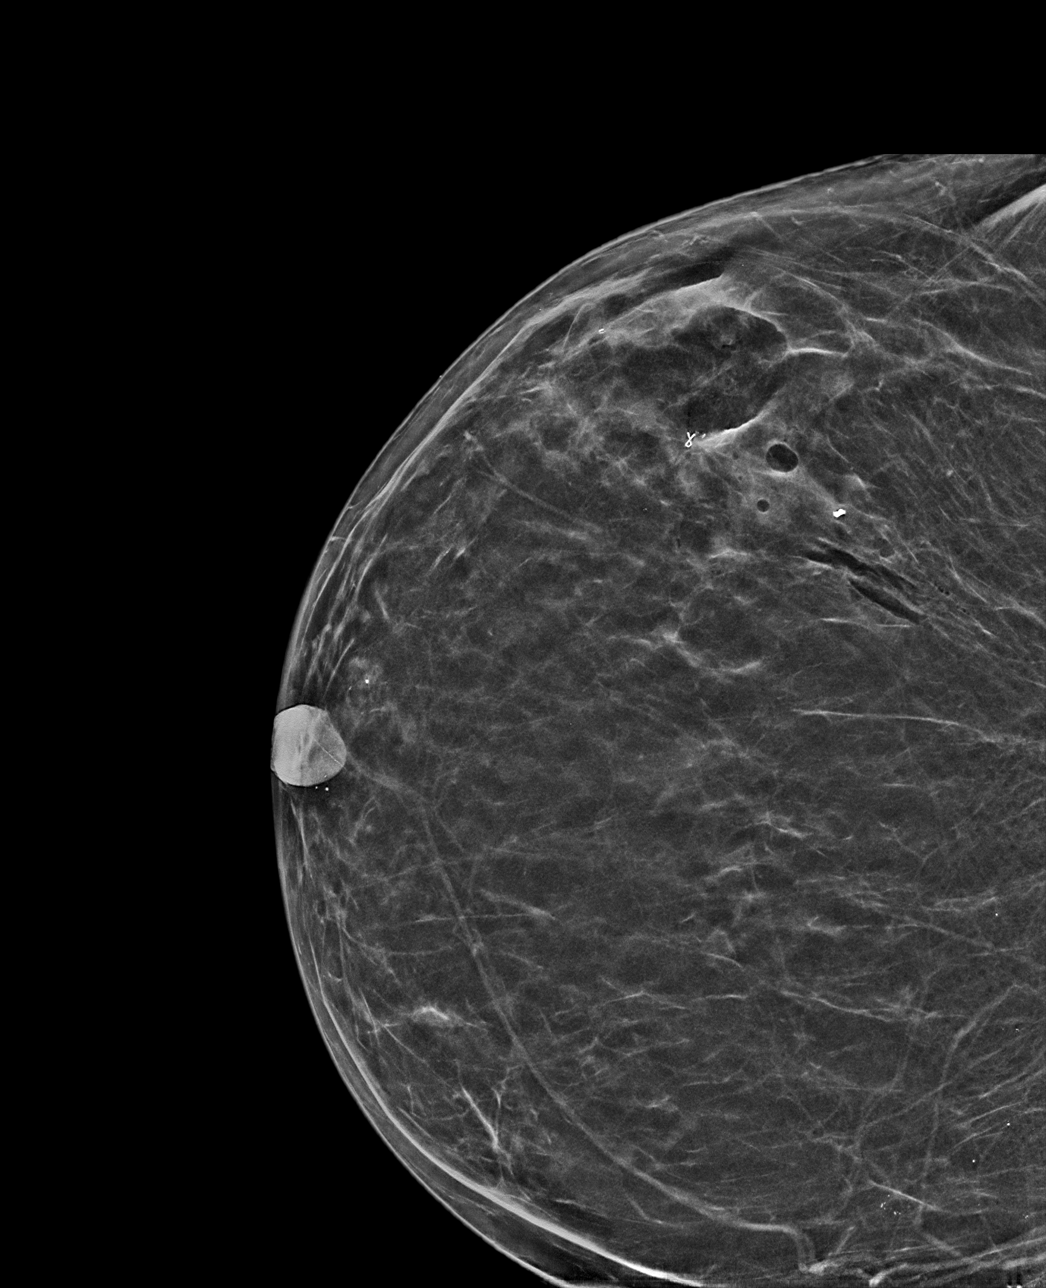

[R ML synth-2D]
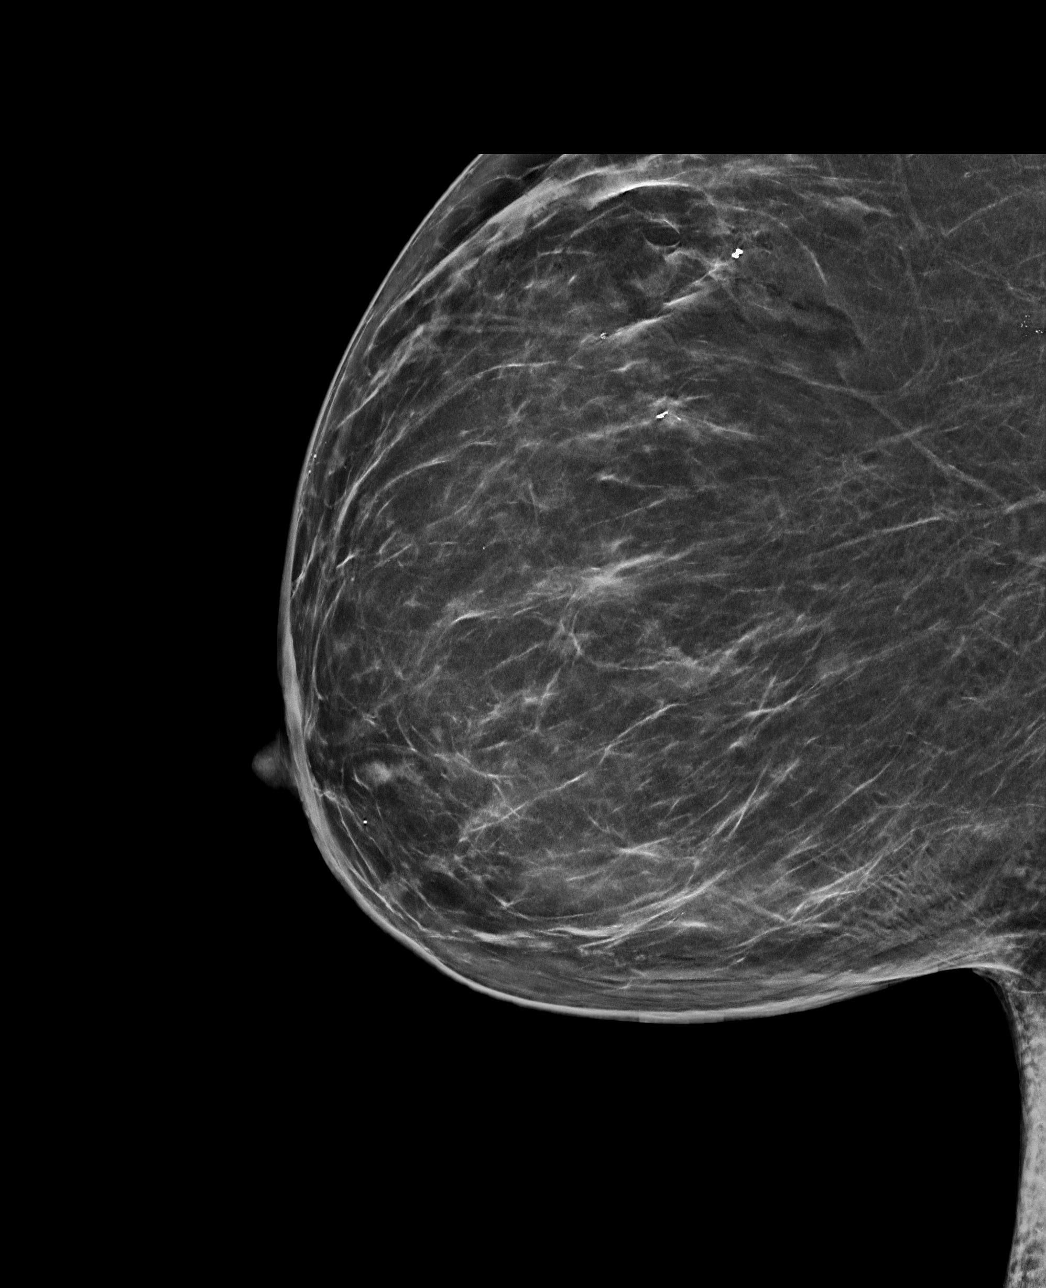

[R ML tomo · tomo slice 39/78.0]
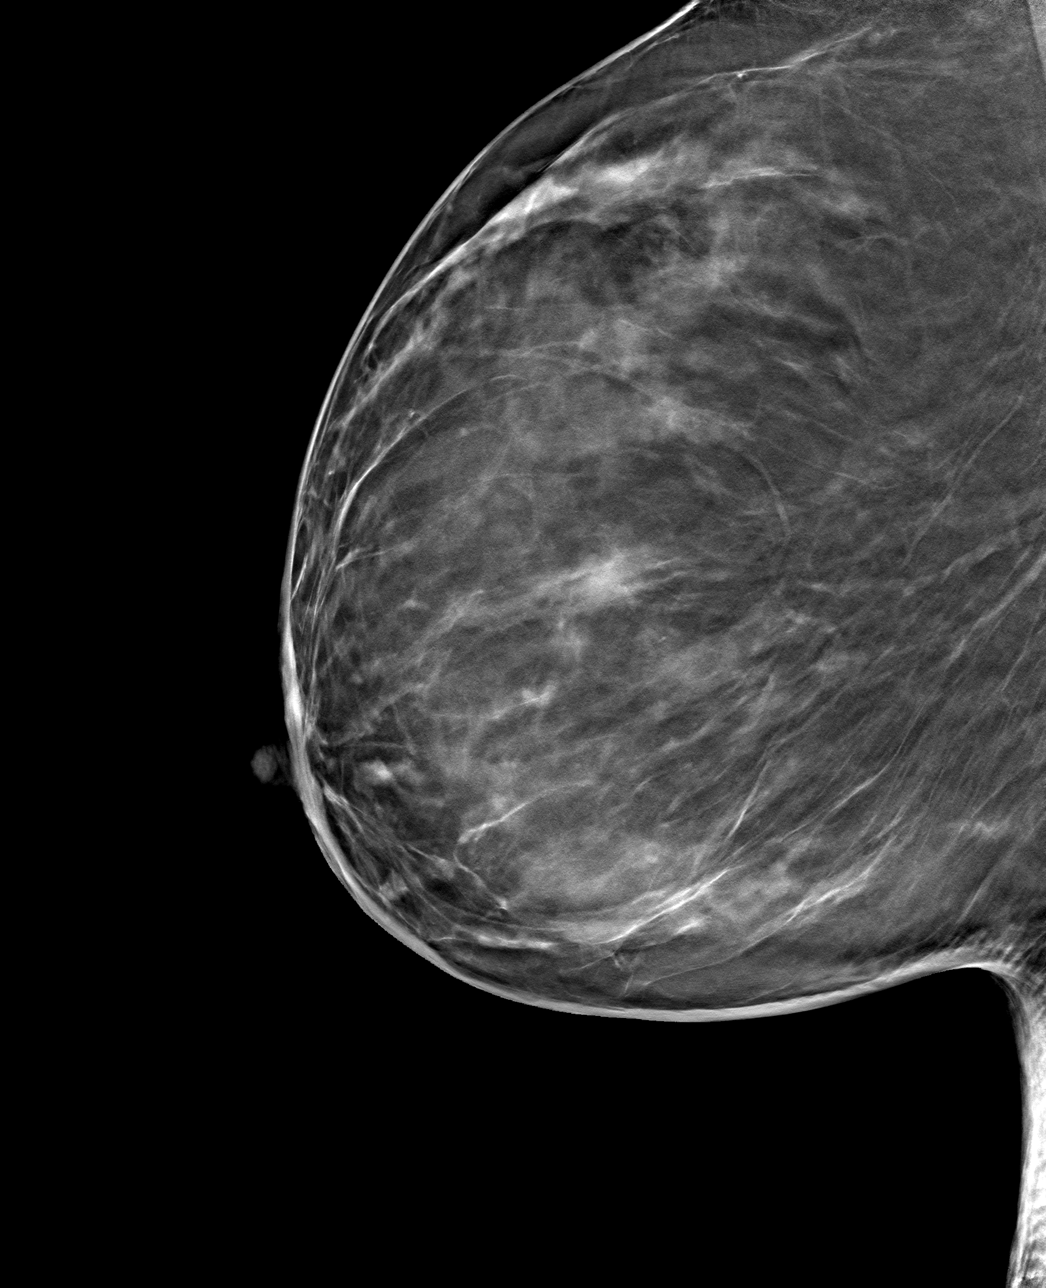

[R CC tomo · tomo slice 33/65.0]
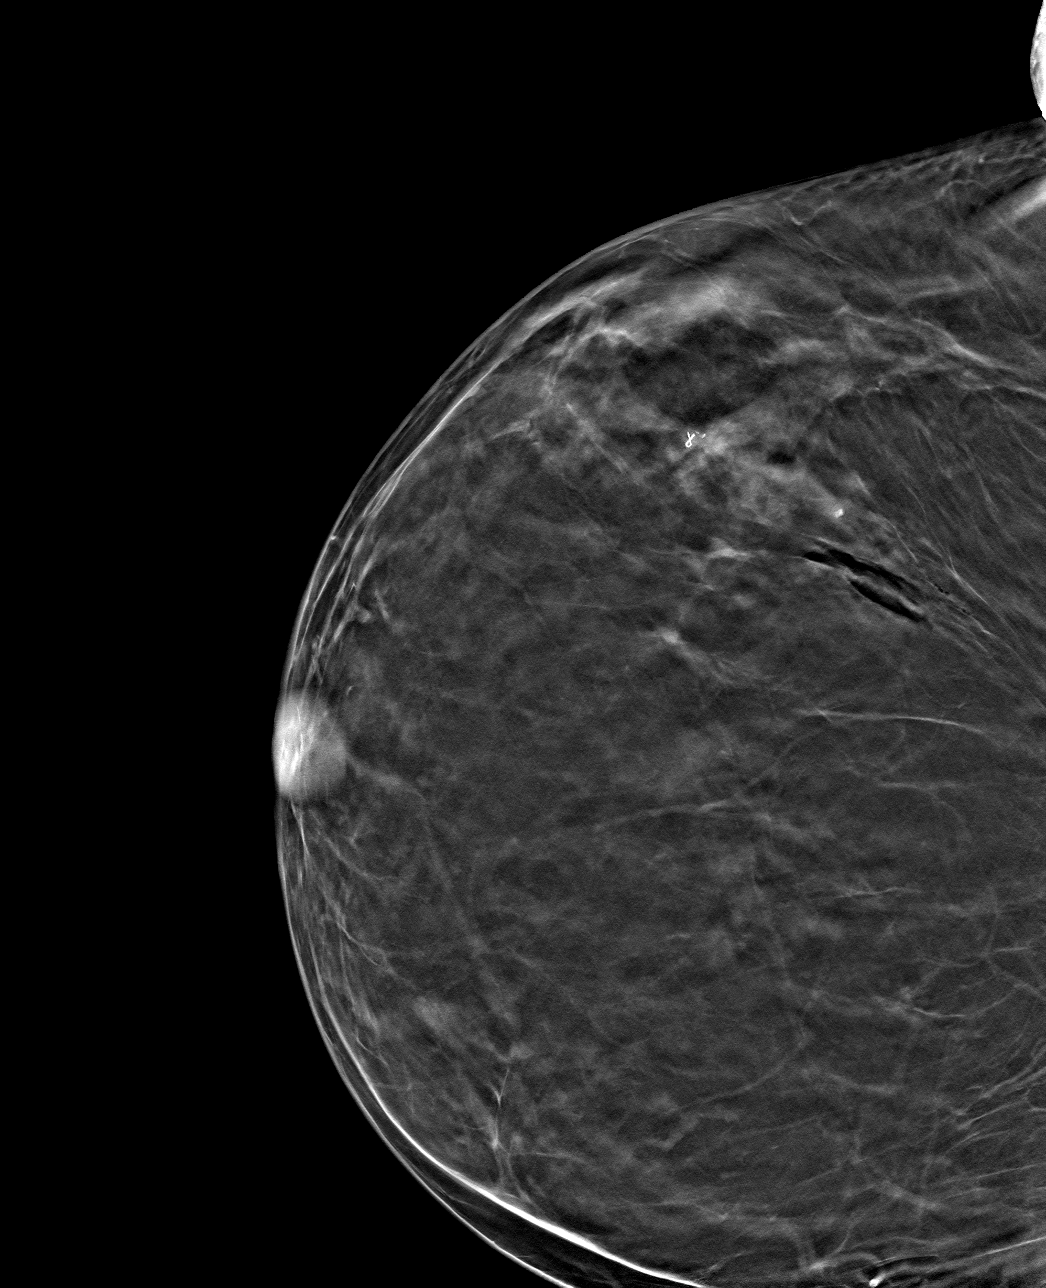

[4 of 12 positions shown; findings below may reference images not displayed]

FINDINGS: 3D Mammographic images were obtained following MR guided biopsy of
the 1.3 cm mass within the UPPER-OUTER RIGHT breast. The BARBELL
biopsy marking clip is in expected position at the site of biopsy
within the UPPER-OUTER RIGHT breast.

The BARBELL clip lies 3.5 cm SUPERIOR to the biopsy-proven
malignancy within the UPPER-OUTER RIGHT breast (RIBBON clip).
IMPRESSION: Appropriate positioning of the BARBELL shaped biopsy marking clip at
the site of biopsy in the UPPER-OUTER RIGHT breast.

Final Assessment: Post Procedure Mammograms for Marker Placement

## 2021-06-10 ENCOUNTER — Other Ambulatory Visit: Payer: Self-pay | Admitting: Internal Medicine

## 2021-07-01 ENCOUNTER — Other Ambulatory Visit: Payer: Self-pay

## 2021-07-01 ENCOUNTER — Inpatient Hospital Stay: Payer: Managed Care, Other (non HMO) | Attending: Internal Medicine | Admitting: Internal Medicine

## 2021-07-01 DIAGNOSIS — Z79899 Other long term (current) drug therapy: Secondary | ICD-10-CM | POA: Diagnosis not present

## 2021-07-01 DIAGNOSIS — Z170 Estrogen receptor positive status [ER+]: Secondary | ICD-10-CM | POA: Diagnosis not present

## 2021-07-01 DIAGNOSIS — G4700 Insomnia, unspecified: Secondary | ICD-10-CM | POA: Insufficient documentation

## 2021-07-01 DIAGNOSIS — B029 Zoster without complications: Secondary | ICD-10-CM | POA: Diagnosis not present

## 2021-07-01 DIAGNOSIS — N951 Menopausal and female climacteric states: Secondary | ICD-10-CM | POA: Insufficient documentation

## 2021-07-01 DIAGNOSIS — C50411 Malignant neoplasm of upper-outer quadrant of right female breast: Secondary | ICD-10-CM | POA: Diagnosis not present

## 2021-07-01 NOTE — Assessment & Plan Note (Addendum)
#  Right breast cancer lobular-stage I grade 2-ER/PR positive HER-2 negative; Currently on tamoxifen [Dr.schermerhorn]. Tolerating well. STABLE; Dr. Bary Castilla- December 2021- mammo/US- NEG.  # Hot flashes: G-1-stable.  Monitor for now.  # Shingles: On Valtrex.  # Insomnia-improved likely from phentermine.   # DISPOSITION: # follow up in 6 months-MD- no labs- Dr.B

## 2021-07-01 NOTE — Progress Notes (Signed)
one [place] NOTE  Patient Care Team: Adin Hector, MD as PCP - General (Internal Medicine) Rico Junker, RN as Registered Nurse Bary Castilla, Forest Gleason, MD as Consulting Physician (General Surgery) Noreene Filbert, MD as Referring Physician (Radiation Oncology) Cammie Sickle, MD as Consulting Physician (Internal Medicine)  CHIEF COMPLAINTS/PURPOSE OF CONSULTATION: Breast cancer  #  Oncology History Overview Note  # JAN 2021- INVASIVE LOBULAR CARCINOMA, TUBULOLOBULAR PATTERN s/p Lumpectomy; re-excision [Dr.Byrnett]; G-2 ER/PR> 90%; HER-2 negative;  pT1c pN0 (sn); Oncotype RS- 17; NO chemo. S/p RT -mammosite  # April 20/2021- START TAMOXIFEN  # DM; Psoriatic arthritis [Enbrel;Dr.Kernodle];   # SURVIVORSHIP: pending  DIAGNOSIS: Breast cancer  STAGE:  I       ;  GOALS: cure  CURRENT/MOST RECENT THERAPY : Tamoxifen        Carcinoma of upper-outer quadrant of right breast in female, estrogen receptor positive ([place])  09/22/2019 Initial Diagnosis   Carcinoma of upper-outer quadrant of right breast in female, estrogen receptor positive ([place])      HISTORY OF PRESENTING ILLNESS: Alone.  Ambulating independently. Kaitlyn Potter 50 y.o.  female perimenopausal stage I lobular cancer ER/PR positive HER-2 negative on  Tamoxifen.  Patient admits to mild hot flashes.  Also admits to mild mood swings.  Otherwise able to sleep well at night since stopping phentermine.  Patient is taking phentermine very rarely.  Recently diagnosed with shingles on Valtrex.  Review of Systems  Constitutional:  Negative for chills, diaphoresis, fever, malaise/fatigue and weight loss.  HENT:  Negative for nosebleeds and sore throat.   Eyes:  Negative for double vision.  Respiratory:  Negative for cough, hemoptysis, sputum production, shortness of breath and wheezing.   Cardiovascular:  Negative for chest pain, palpitations, orthopnea and leg swelling.   Gastrointestinal:  Negative for abdominal pain, blood in stool, constipation, diarrhea, heartburn, melena, nausea and vomiting.  Genitourinary:  Negative for dysuria, frequency and urgency.  Musculoskeletal:  Positive for joint pain. Negative for back pain.  Skin: Negative.  Negative for itching and rash.  Neurological:  Negative for dizziness, tingling, focal weakness, weakness and headaches.  Endo/Heme/Allergies:  Does not bruise/bleed easily.  Psychiatric/Behavioral:  Negative for depression. The patient has insomnia. The patient is not nervous/anxious.     MEDICAL HISTORY:  Past Medical History:  Diagnosis Date  . Acquired hypothyroidism   . Arthritis    psoriatic  . Asthma   . Breast cancer ([place])   . Carcinoma of upper-outer quadrant of right breast in female, estrogen receptor positive ([place])   . Heart murmur   . Hypertension   . Hypertriglyceridemia   . Personal history of radiation therapy 10/2019   rt breast  . Psoriasis   . Type 2 diabetes mellitus with microalbuminuria, without long-term current use of insulin ([place])     SURGICAL HISTORY: Past Surgical History:  Procedure Laterality Date  . BREAST BIOPSY Right 09/08/2016   affrim stereo for calcs attempted but area to close to skin  . BREAST BIOPSY Right 08/2019   Inv lobular CA with high grade DCIS  . BREAST LUMPECTOMY Right 10/27/2019   Invasive Lobular CA with high grade DCIS + ant margin  . BREAST LUMPECTOMY Right 11/06/2019   re-excision  . BREAST LUMPECTOMY WITH NEEDLE LOCALIZATION Right 10/27/2019   Procedure: BREAST LUMPECTOMY WITH NEEDLE LOCALIZATION;  Surgeon: Robert Bellow, MD;  Location: [facility] ORS;  Service: General;  Laterality: Right;  and SLN  . LAPAROSCOPIC CHOLECYSTECTOMY    .  lasix eye surgery    . RE-EXCISION OF BREAST CANCER,SUPERIOR MARGINS Right 11/06/2019   Procedure: RE-EXCISION OF BREAST CANCER,SUPERIOR MARGINS;  Surgeon: Robert Bellow, MD;  Location: [facility] ORS;  Service: General;   Laterality: Right;  . WISDOM TOOTH EXTRACTION      SOCIAL HISTORY: Social History   Socioeconomic History  . Marital status: Single    Spouse name: Not on file  . Number of children: Not on file  . Years of education: Not on file  . Highest education level: Not on file  Occupational History  . Not on file  Tobacco Use  . Smoking status: Some Days  . Smokeless tobacco: Never  . Tobacco comments:    rarely  Vaping Use  . Vaping Use: Never used  Substance and Sexual Activity  . Alcohol use: Yes    Alcohol/week: 1.0 - 2.0 standard drink    Types: 1 - 2 Glasses of wine per week    Comment: rarely  . Drug use: Never  . Sexual activity: Not on file  Other Topics Concern  . 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    Social Determinants of Health   Financial Resource Strain: Not on file  Food Insecurity: Not on file  Transportation Needs: Not on file  Physical Activity: Not on file  Stress: Not on file  Social Connections: Not on file  Intimate Partner Violence: Not on file    FAMILY HISTORY: Family History  Problem Relation Age of Onset  . Prostate cancer Paternal Grandfather   . Asthma Father   . Stroke Father   . Hyperlipidemia Father   . Diabetes Mother   . Thyroid disease Mother   . Breast cancer Neg Hx     ALLERGIES:  is allergic to sulfa antibiotics and clarithromycin.  MEDICATIONS:  Current Outpatient Medications  Medication Sig Dispense Refill  . albuterol (VENTOLIN HFA) 108 (90 Base) MCG/ACT inhaler Inhale 2 puffs into the lungs every 6 (six) hours as needed for wheezing or shortness of breath.     Marland Kitchen aspirin EC 81 MG tablet Take 81 mg by mouth daily. Swallow whole.    . cetirizine (ZYRTEC) 10 MG tablet Take 10 mg by mouth daily.    . Cholecalciferol 25 MCG (1000 UT) capsule Take 1,000 Units by mouth daily.     . cyanocobalamin  (,VITAMIN B-12,) 1000 MCG/ML injection Inject 1,000 mcg into the muscle every 30 (thirty) days.    Marland Kitchen docusate sodium (COLACE) 100 MG capsule Take 100 mg by mouth daily.    Marland Kitchen etanercept (ENBREL SURECLICK) 50 MG/ML injection Inject 50 mg into the skin every 14 (fourteen) days.     . fluticasone (FLONASE) 50 MCG/ACT nasal spray Place 2 sprays into both nostrils daily as needed for allergies.     . Glucose Blood (COOL BLOOD GLUCOSE TEST STRIPS VI) Use 3 (three) times daily Use as instructed. Freestyle Lite Test Strips E11.29    . levothyroxine (SYNTHROID) 100 MCG tablet Take 1 tablet by mouth daily.    Marland Kitchen levothyroxine (SYNTHROID) 88 MCG tablet Take 1 tablet by mouth daily.    Marland Kitchen losartan-hydrochlorothiazide (HYZAAR) 50-12.5 MG tablet Take 1 tablet by mouth daily.    . Multiple Vitamin (MULTIVITAMIN) capsule Take 1 capsule by mouth daily.    . phentermine (ADIPEX-P) 37.5 MG tablet Take 37.5 mg by mouth daily before  breakfast.     . Semaglutide, 1 MG/DOSE, (OZEMPIC, 1 MG/DOSE,) 2 MG/1.5ML SOPN Inject 1 mg as directed once a week.    . tamoxifen (NOLVADEX) 20 MG tablet TAKE 1 TABLET BY MOUTH EVERY DAY 90 tablet 0  . traZODone (DESYREL) 50 MG tablet Take 1 tablet by mouth as needed for sleep. (Patient not taking: No sig reported)    . valACYclovir (VALTREX) 1000 MG tablet Take 1,000 mg by mouth 3 (three) times daily.     No current facility-administered medications for this visit.      Marland Kitchen  PHYSICAL EXAMINATION: ECOG PERFORMANCE STATUS: 0 - Asymptomatic  Vitals:   07/01/21 1111  BP: 138/74  Pulse: 98  Temp: (!) 97.1 F (36.2 C)   Filed Weights   07/01/21 1111  Weight: 231 lb (104.8 kg)    Physical Exam Constitutional:      Comments: Patient is alone.  HENT:     Head: Normocephalic and atraumatic.     Mouth/Throat:     Pharynx: No oropharyngeal exudate.  Eyes:     Pupils: Pupils are equal, round, and reactive to light.  Cardiovascular:     Rate and Rhythm: Normal rate and regular  rhythm.  Pulmonary:     Effort: Pulmonary effort is normal. No respiratory distress.     Breath sounds: Normal breath sounds. No wheezing.  Abdominal:     General: Bowel sounds are normal. There is no distension.     Palpations: Abdomen is soft. There is no mass.     Tenderness: There is no abdominal tenderness. There is no guarding or rebound.  Musculoskeletal:        General: No tenderness. Normal range of motion.     Cervical back: Normal range of motion and neck supple.  Skin:    General: Skin is warm.  Neurological:     Mental Status: She is alert and oriented to person, place, and time.  Psychiatric:        Mood and Affect: Affect normal.    Vesicular lesions noted right-nape of the neck. LABORATORY DATA:  I have reviewed the data as listed Lab Results  Component Value Date   WBC 8.6 09/22/2019   HGB 14.5 09/22/2019   HCT 43.7 09/22/2019   MCV 93.4 09/22/2019   PLT 308 09/22/2019   No results for input(s): NA, K, CL, CO2, GLUCOSE, BUN, CREATININE, CALCIUM, GFRNONAA, GFRAA, PROT, ALBUMIN, AST, ALT, ALKPHOS, BILITOT, BILIDIR, IBILI in the last 8760 hours.  RADIOGRAPHIC STUDIES: I have personally reviewed the radiological images as listed and agreed with the findings in the report. No results found.  ASSESSMENT & PLAN:   Carcinoma of upper-outer quadrant of right breast in female, estrogen receptor positive ([place]) #Right breast cancer lobular-stage I grade 2-ER/PR positive HER-2 negative; Currently on tamoxifen [Dr.schermerhorn]. Tolerating well. STABLE; Dr. Bary Castilla- December 2021- mammo/US- NEG.  # Hot flashes: G-1-stable.  Monitor for now.  # Shingles: On Valtrex.  # Insomnia-improved likely from phentermine.   # DISPOSITION: # follow up in 6 months-MD- no labs- Dr.B  All questions were answered. The patient/family knows to call the clinic with any problems, questions or concerns.     Cammie Sickle, MD 07/01/2021 1:19 PM

## 2021-07-18 ENCOUNTER — Ambulatory Visit: Payer: Managed Care, Other (non HMO) | Admitting: Radiation Oncology

## 2021-07-20 ENCOUNTER — Other Ambulatory Visit: Payer: Self-pay | Admitting: Internal Medicine

## 2021-07-23 ENCOUNTER — Other Ambulatory Visit: Payer: Self-pay | Admitting: General Surgery

## 2021-07-23 DIAGNOSIS — C50911 Malignant neoplasm of unspecified site of right female breast: Secondary | ICD-10-CM

## 2021-08-29 ENCOUNTER — Other Ambulatory Visit: Payer: Self-pay

## 2021-08-29 ENCOUNTER — Ambulatory Visit
Admission: RE | Admit: 2021-08-29 | Discharge: 2021-08-29 | Disposition: A | Discharge status: Home / Self Care | Payer: Managed Care, Other (non HMO) | Source: Ambulatory Visit | Attending: General Surgery | Admitting: General Surgery

## 2021-08-29 DIAGNOSIS — C50911 Malignant neoplasm of unspecified site of right female breast: Secondary | ICD-10-CM | POA: Diagnosis present

## 2021-12-30 ENCOUNTER — Inpatient Hospital Stay: Payer: Managed Care, Other (non HMO) | Attending: Internal Medicine | Admitting: Internal Medicine

## 2021-12-30 ENCOUNTER — Encounter: Payer: Self-pay | Admitting: Internal Medicine

## 2021-12-30 DIAGNOSIS — E119 Type 2 diabetes mellitus without complications: Secondary | ICD-10-CM | POA: Diagnosis not present

## 2021-12-30 DIAGNOSIS — L4050 Arthropathic psoriasis, unspecified: Secondary | ICD-10-CM | POA: Diagnosis not present

## 2021-12-30 DIAGNOSIS — Z170 Estrogen receptor positive status [ER+]: Secondary | ICD-10-CM | POA: Insufficient documentation

## 2021-12-30 DIAGNOSIS — Z923 Personal history of irradiation: Secondary | ICD-10-CM | POA: Diagnosis not present

## 2021-12-30 DIAGNOSIS — C50411 Malignant neoplasm of upper-outer quadrant of right female breast: Secondary | ICD-10-CM | POA: Diagnosis present

## 2021-12-30 DIAGNOSIS — Z7982 Long term (current) use of aspirin: Secondary | ICD-10-CM | POA: Diagnosis not present

## 2021-12-30 DIAGNOSIS — Z79810 Long term (current) use of selective estrogen receptor modulators (SERMs): Secondary | ICD-10-CM | POA: Diagnosis not present

## 2021-12-30 DIAGNOSIS — Z8042 Family history of malignant neoplasm of prostate: Secondary | ICD-10-CM | POA: Diagnosis not present

## 2021-12-30 DIAGNOSIS — Z79899 Other long term (current) drug therapy: Secondary | ICD-10-CM | POA: Diagnosis not present

## 2021-12-30 NOTE — Progress Notes (Signed)
Discuss her new weight loss program. ?

## 2021-12-30 NOTE — Assessment & Plan Note (Addendum)
#  Right breast cancer lobular-stage I grade 2-ER/PR positive HER-2 negative; Currently on Tamoxifen [Dr.schermerhorn]. Tolerating well. ; STABLE; Dr. Bary Castilla- December 2022- mammo/US- NEG. ? ?# Hot flashes: G-1-stable.  Monitor for now.STABLE ? ?# weight gain - 30 pounds in 6 months/ weight loss program [[place]- weight loss program-started] ? ?# DISPOSITION: ?# follow up in 6 months-MD- no labs- Dr.B ? ?

## 2021-12-30 NOTE — Progress Notes (Signed)
[facility] [place] ?CONSULT NOTE ? ?Patient Care Team: ?Adin Hector, MD as PCP - General (Internal Medicine) ?Rico Junker, RN as Registered Nurse ?Robert Bellow, MD as Consulting Physician (General Surgery) ?Noreene Filbert, MD as Referring Physician (Radiation Oncology) ?Cammie Sickle, MD as Consulting Physician (Internal Medicine) ? ?CHIEF COMPLAINTS/PURPOSE OF CONSULTATION: Breast cancer ? ?#  ?Oncology History Overview Note  ?# JAN 2021- INVASIVE LOBULAR CARCINOMA, TUBULOLOBULAR PATTERN s/p Lumpectomy; re-excision [Dr.Byrnett]; G-2 ER/PR> 90%; HER-2 negative;  pT1c pN0 (sn); Oncotype RS- 17; NO chemo. S/p RT -mammosite ? ?# April 20/2021- START TAMOXIFEN ? ?# DM; Psoriatic arthritis [Enbrel;Dr.Kernodle];  ? ?# SURVIVORSHIP: pending ? ?DIAGNOSIS: Breast cancer ? ?STAGE:  I       ;  GOALS: cure ? ?CURRENT/MOST RECENT THERAPY : Tamoxifen ? ? ? ? ? ?  ?Carcinoma of upper-outer quadrant of right breast in female, estrogen receptor positive ([place])  ?09/22/2019 Initial Diagnosis  ? Carcinoma of upper-outer quadrant of right breast in female, estrogen receptor positive ([place]) ?  ? ? ? ?HISTORY OF PRESENTING ILLNESS: Alone.  Ambulating independently. ?Kaitlyn Potter 51 y.o.  female perimenopausal stage I lobular cancer ER/PR positive HER-2 negative on  Tamoxifen. ? ?Patient denies any worsening hot flashes.  Patient admits to weight gain.  Otherwise denies any mood swings.  ? ?Review of Systems  ?Constitutional:  Negative for chills, diaphoresis, fever, malaise/fatigue and weight loss.  ?HENT:  Negative for nosebleeds and sore throat.   ?Eyes:  Negative for double vision.  ?Respiratory:  Negative for cough, hemoptysis, sputum production, shortness of breath and wheezing.   ?Cardiovascular:  Negative for chest pain, palpitations, orthopnea and leg swelling.  ?Gastrointestinal:  Negative for abdominal pain, blood in stool, constipation, diarrhea, heartburn, melena, nausea and vomiting.   ?Genitourinary:  Negative for dysuria, frequency and urgency.  ?Musculoskeletal:  Positive for joint pain. Negative for back pain.  ?Skin: Negative.  Negative for itching and rash.  ?Neurological:  Negative for dizziness, tingling, focal weakness, weakness and headaches.  ?Endo/Heme/Allergies:  Does not bruise/bleed easily.  ?Psychiatric/Behavioral:  Negative for depression. The patient has insomnia. The patient is not nervous/anxious.    ? ?MEDICAL HISTORY:  ?Past Medical History:  ?Diagnosis Date  ? Acquired hypothyroidism   ? Arthritis   ? psoriatic  ? Asthma   ? Breast cancer ([place])   ? Carcinoma of upper-outer quadrant of right breast in female, estrogen receptor positive ([place])   ? Heart murmur   ? Hypertension   ? Hypertriglyceridemia   ? Personal history of radiation therapy 10/2019  ? rt breast  ? Psoriasis   ? Type 2 diabetes mellitus with microalbuminuria, without long-term current use of insulin ([place])   ? ? ?SURGICAL HISTORY: ?Past Surgical History:  ?Procedure Laterality Date  ? BREAST BIOPSY Right 09/08/2016  ? affrim stereo for calcs attempted but area to close to skin  ? BREAST BIOPSY Right 08/2019  ? Inv lobular CA with high grade DCIS  ? BREAST LUMPECTOMY Right 10/27/2019  ? Invasive Lobular CA with high grade DCIS + ant margin  ? BREAST LUMPECTOMY Right 11/06/2019  ? re-excision  ? BREAST LUMPECTOMY WITH NEEDLE LOCALIZATION Right 10/27/2019  ? Procedure: BREAST LUMPECTOMY WITH NEEDLE LOCALIZATION;  Surgeon: Robert Bellow, MD;  Location: [facility] ORS;  Service: General;  Laterality: Right;  and SLN  ? LAPAROSCOPIC CHOLECYSTECTOMY    ? lasix eye surgery    ? RE-EXCISION OF BREAST CANCER,SUPERIOR MARGINS Right 11/06/2019  ?  Procedure: RE-EXCISION OF BREAST CANCER,SUPERIOR MARGINS;  Surgeon: Robert Bellow, MD;  Location: [facility] ORS;  Service: General;  Laterality: Right;  ? WISDOM TOOTH EXTRACTION    ? ? ?SOCIAL HISTORY: ?Social History  ? ?Socioeconomic History  ? Marital status: Single  ?  Spouse  name: Not on file  ? Number of children: Not on file  ? Years of education: Not on file  ? Highest education level: Not on file  ?Occupational History  ? Not on file  ?Tobacco Use  ? Smoking status: Some Days  ? Smokeless tobacco: Never  ? Tobacco comments:  ?  rarely  ?Vaping Use  ? Vaping Use: Never used  ?Substance and Sexual Activity  ? Alcohol use: Yes  ?  Alcohol/week: 1.0 - 2.0 standard drink  ?  Types: 1 - 2 Glasses of wine per week  ?  Comment: rarely  ? Drug use: Never  ? Sexual activity: Not on file  ?Other Topics Concern  ? Not on file  ?Social History Narrative  ? 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   ? ?Social Determinants of Health  ? ?Financial Resource Strain: Not on file  ?Food Insecurity: Not on file  ?Transportation Needs: Not on file  ?Physical Activity: Not on file  ?Stress: Not on file  ?Social Connections: Not on file  ?Intimate Partner Violence: Not on file  ? ? ?FAMILY HISTORY: ?Family History  ?Problem Relation Age of Onset  ? Prostate cancer Paternal Grandfather   ? Asthma Father   ? Stroke Father   ? Hyperlipidemia Father   ? Diabetes Mother   ? Thyroid disease Mother   ? Breast cancer Neg Hx   ? ? ?ALLERGIES:  is allergic to sulfa antibiotics and clarithromycin. ? ?MEDICATIONS:  ?Current Outpatient Medications  ?Medication Sig Dispense Refill  ? albuterol (VENTOLIN HFA) 108 (90 Base) MCG/ACT inhaler Inhale 2 puffs into the lungs every 6 (six) hours as needed for wheezing or shortness of breath.     ? aspirin EC 81 MG tablet Take 81 mg by mouth daily. Swallow whole.    ? cetirizine (ZYRTEC) 10 MG tablet Take 10 mg by mouth daily.    ? Cholecalciferol 25 MCG (1000 UT) capsule Take 1,000 Units by mouth daily.     ? cyanocobalamin (,VITAMIN B-12,) 1000 MCG/ML injection Inject 1,000 mcg into the muscle every 30 (thirty) days.    ? docusate sodium (COLACE) 100 MG capsule Take 100 mg by mouth daily.    ?  etanercept (ENBREL SURECLICK) 50 MG/ML injection Inject 50 mg into the skin every 14 (fourteen) days.     ? fluticasone (FLONASE) 50 MCG/ACT nasal spray Place 2 sprays into both nostrils daily as needed for allergies.     ? Glucose Blood (COOL BLOOD GLUCOSE TEST STRIPS VI) Use 3 (three) times daily Use as instructed. Freestyle Lite Test Strips E11.29    ? losartan-hydrochlorothiazide (HYZAAR) 50-12.5 MG tablet Take 1 tablet by mouth daily.    ? Multiple Vitamin (MULTIVITAMIN) capsule Take 1 capsule by mouth daily.    ? Semaglutide, 1 MG/DOSE, (OZEMPIC, 1 MG/DOSE,) 2 MG/1.5ML SOPN Inject 1 mg as directed once a week.    ? tamoxifen (NOLVADEX) 20 MG tablet TAKE 1 TABLET BY MOUTH EVERY DAY 90 tablet 1  ? phentermine (ADIPEX-P) 37.5 MG tablet Take 37.5 mg by mouth daily before breakfast.     ? traZODone (DESYREL)  50 MG tablet Take 1 tablet by mouth as needed for sleep. (Patient not taking: No sig reported)    ? ?No current facility-administered medications for this visit.  ? ? ?  ?. ? ?PHYSICAL EXAMINATION: ?ECOG PERFORMANCE STATUS: 0 - Asymptomatic ? ?Vitals:  ? 12/30/21 1042  ?BP: (!) 134/94  ?Pulse: (!) 105  ?Temp: 98.4 ?F (36.9 ?C)  ?SpO2: 98%  ? ?Filed Weights  ? 12/30/21 1042  ?Weight: 261 lb (118.4 kg)  ? ? ?Physical Exam ?Constitutional:   ?   Comments: Patient is alone.  ?HENT:  ?   Head: Normocephalic and atraumatic.  ?   Mouth/Throat:  ?   Pharynx: No oropharyngeal exudate.  ?Eyes:  ?   Pupils: Pupils are equal, round, and reactive to light.  ?Cardiovascular:  ?   Rate and Rhythm: Normal rate and regular rhythm.  ?Pulmonary:  ?   Effort: Pulmonary effort is normal. No respiratory distress.  ?   Breath sounds: Normal breath sounds. No wheezing.  ?Abdominal:  ?   General: Bowel sounds are normal. There is no distension.  ?   Palpations: Abdomen is soft. There is no mass.  ?   Tenderness: There is no abdominal tenderness. There is no guarding or rebound.  ?Musculoskeletal:     ?   General: No tenderness. Normal  range of motion.  ?   Cervical back: Normal range of motion and neck supple.  ?Skin: ?   General: Skin is warm.  ?Neurological:  ?   Mental Status: She is alert and oriented to person, place, and time.  ?Psychiatr

## 2022-02-09 LAB — COLOGUARD: COLOGUARD: NEGATIVE

## 2022-03-04 ENCOUNTER — Other Ambulatory Visit: Payer: Self-pay | Admitting: Internal Medicine

## 2022-06-28 ENCOUNTER — Other Ambulatory Visit: Payer: Self-pay | Admitting: Internal Medicine

## 2022-07-01 ENCOUNTER — Ambulatory Visit: Payer: Managed Care, Other (non HMO) | Admitting: Internal Medicine

## 2022-07-10 ENCOUNTER — Inpatient Hospital Stay: Payer: Managed Care, Other (non HMO) | Admitting: Internal Medicine

## 2022-07-20 ENCOUNTER — Other Ambulatory Visit: Payer: Self-pay | Admitting: General Surgery

## 2022-07-20 DIAGNOSIS — C50911 Malignant neoplasm of unspecified site of right female breast: Secondary | ICD-10-CM

## 2022-08-31 ENCOUNTER — Ambulatory Visit
Admission: RE | Admit: 2022-08-31 | Discharge: 2022-08-31 | Disposition: A | Discharge status: Home / Self Care | Payer: Managed Care, Other (non HMO) | Source: Ambulatory Visit | Attending: General Surgery | Admitting: General Surgery

## 2022-08-31 DIAGNOSIS — C50911 Malignant neoplasm of unspecified site of right female breast: Secondary | ICD-10-CM | POA: Insufficient documentation

## 2023-03-13 ENCOUNTER — Other Ambulatory Visit: Payer: Self-pay | Admitting: Internal Medicine

## 2023-07-15 ENCOUNTER — Other Ambulatory Visit: Payer: Self-pay | Admitting: General Surgery

## 2023-07-15 DIAGNOSIS — Z1231 Encounter for screening mammogram for malignant neoplasm of breast: Secondary | ICD-10-CM

## 2023-09-03 ENCOUNTER — Ambulatory Visit
Admission: RE | Admit: 2023-09-03 | Discharge: 2023-09-03 | Disposition: A | Discharge status: Home / Self Care | Payer: Managed Care, Other (non HMO) | Source: Ambulatory Visit | Attending: General Surgery | Admitting: General Surgery

## 2023-09-03 DIAGNOSIS — Z1231 Encounter for screening mammogram for malignant neoplasm of breast: Secondary | ICD-10-CM | POA: Insufficient documentation

## 2023-09-09 ENCOUNTER — Other Ambulatory Visit: Payer: Self-pay | Admitting: General Surgery

## 2023-09-09 DIAGNOSIS — R928 Other abnormal and inconclusive findings on diagnostic imaging of breast: Secondary | ICD-10-CM

## 2023-09-13 ENCOUNTER — Ambulatory Visit
Admission: RE | Admit: 2023-09-13 | Discharge: 2023-09-13 | Disposition: A | Discharge status: Home / Self Care | Payer: Managed Care, Other (non HMO) | Source: Ambulatory Visit | Attending: General Surgery | Admitting: General Surgery

## 2023-09-13 DIAGNOSIS — R928 Other abnormal and inconclusive findings on diagnostic imaging of breast: Secondary | ICD-10-CM | POA: Insufficient documentation

## 2023-09-14 ENCOUNTER — Other Ambulatory Visit: Payer: Self-pay | Admitting: General Surgery

## 2023-09-14 DIAGNOSIS — R928 Other abnormal and inconclusive findings on diagnostic imaging of breast: Secondary | ICD-10-CM

## 2023-09-14 DIAGNOSIS — N6489 Other specified disorders of breast: Secondary | ICD-10-CM

## 2023-09-16 ENCOUNTER — Ambulatory Visit
Admission: RE | Admit: 2023-09-16 | Discharge: 2023-09-16 | Disposition: A | Discharge status: Home / Self Care | Payer: Managed Care, Other (non HMO) | Source: Ambulatory Visit | Attending: General Surgery | Admitting: General Surgery

## 2023-09-16 DIAGNOSIS — N6489 Other specified disorders of breast: Secondary | ICD-10-CM

## 2023-09-16 DIAGNOSIS — R928 Other abnormal and inconclusive findings on diagnostic imaging of breast: Secondary | ICD-10-CM | POA: Insufficient documentation

## 2023-09-16 HISTORY — PX: BREAST BIOPSY: SHX20

## 2023-09-16 MED ORDER — LIDOCAINE HCL 1 % IJ SOLN
10.0000 mL | Freq: Once | INTRAMUSCULAR | Status: AC
  Administered 2023-09-16: 10 mL
  Filled 2023-09-16: qty 10

## 2023-09-16 MED ORDER — LIDOCAINE 1 % OPTIME INJ - NO CHARGE
5.0000 mL | Freq: Once | INTRAMUSCULAR | Status: AC
  Administered 2023-09-16: 5 mL
  Filled 2023-09-16: qty 6

## 2023-09-16 MED ORDER — LIDOCAINE-EPINEPHRINE 1 %-1:100000 IJ SOLN
10.0000 mL | Freq: Once | INTRAMUSCULAR | Status: AC
  Administered 2023-09-16: 10 mL
  Filled 2023-09-16: qty 10

## 2023-09-20 LAB — SURGICAL PATHOLOGY

## 2023-09-29 ENCOUNTER — Other Ambulatory Visit: Payer: Self-pay | Admitting: Internal Medicine

## 2023-10-04 ENCOUNTER — Encounter: Payer: Self-pay | Admitting: Internal Medicine

## 2023-10-04 ENCOUNTER — Inpatient Hospital Stay: Payer: Managed Care, Other (non HMO) | Attending: Internal Medicine | Admitting: Internal Medicine

## 2023-10-04 VITALS — BP 148/79 | HR 100 | Temp 98.5°F | Ht 67.0 in | Wt 267.0 lb

## 2023-10-04 DIAGNOSIS — Z1732 Human epidermal growth factor receptor 2 negative status: Secondary | ICD-10-CM | POA: Insufficient documentation

## 2023-10-04 DIAGNOSIS — Z170 Estrogen receptor positive status [ER+]: Secondary | ICD-10-CM | POA: Insufficient documentation

## 2023-10-04 DIAGNOSIS — Z1721 Progesterone receptor positive status: Secondary | ICD-10-CM | POA: Insufficient documentation

## 2023-10-04 DIAGNOSIS — C50411 Malignant neoplasm of upper-outer quadrant of right female breast: Secondary | ICD-10-CM | POA: Insufficient documentation

## 2023-10-04 DIAGNOSIS — Z79810 Long term (current) use of selective estrogen receptor modulators (SERMs): Secondary | ICD-10-CM | POA: Diagnosis not present

## 2023-10-04 MED ORDER — TAMOXIFEN CITRATE 20 MG PO TABS: 20.0000 mg | ORAL_TABLET | Freq: Every day | ORAL | 4 refills | Status: AC

## 2023-10-04 NOTE — Assessment & Plan Note (Addendum)
#  Right breast cancer lobular-stage I grade 2-ER/PR positive HER-2 negative; Currently on Tamoxifen [Dr.schermerhorn]. Tolerating well. ;stable.  Dr.Cintron/- DEC 2024-abnormal mammogram followed by diagnostic mammogram/ultrasound-biopsy-negative for malignancy [ BENIGN BREAST PARENCHYMA WITH STROMAL FIBROSIS AND ORGANIZING FAT NECROSIS CONSISTENT WITH PRIOR SURGICAL PROCEDURE - FOCAL MICROCALCIFICATION - NEGATIVE FOR MALIGNANCY].   # tamoxifen Until April 2026.   # Hot flashes: G-1-stable.  Monitor for now. Stable.   # weight gain - 30 pounds in 6 months/ weight loss program [[place]- weight loss program-started]; on Ozempic  # DISPOSITION: # follow up in 12 months-MD- no labs- Dr.B

## 2023-10-04 NOTE — Progress Notes (Signed)
Mammogram/Biopsy 09/13/23, 09/16/23.

## 2023-10-04 NOTE — Progress Notes (Signed)
[facility] CONSULT NOTE  Patient Care Team: Lynnea Ferrier, MD as PCP - General (Internal Medicine) Jim Like, RN as Registered Nurse Lemar Livings, Merrily Pew, MD as Consulting Physician (General Surgery) Carmina Miller, MD as Referring Physician (Radiation Oncology) Earna Coder, MD as Consulting Physician (Internal Medicine)  CHIEF COMPLAINTS/PURPOSE OF CONSULTATION: Breast cancer  #  Oncology History Overview Note  # JAN 2021- INVASIVE LOBULAR CARCINOMA, TUBULOLOBULAR PATTERN s/p Lumpectomy; re-excision [Dr.Byrnett]; G-2 ER/PR> 90%; HER-2 negative;  pT1c pN0 (sn); Oncotype RS- 17; NO chemo. S/p RT -mammosite  # April 20/2021- START TAMOXIFEN  # DM; Psoriatic arthritis [Enbrel;Dr.Kernodle];   # SURVIVORSHIP: pending  DIAGNOSIS: Breast cancer  STAGE:  I       ;  GOALS: cure  CURRENT/MOST RECENT THERAPY : Tamoxifen        Carcinoma of upper-outer quadrant of right breast in female, estrogen receptor positive (HCC)  09/22/2019 Initial Diagnosis   Carcinoma of upper-outer quadrant of right breast in female, estrogen receptor positive (HCC)     HISTORY OF PRESENTING ILLNESS: Alone.  Ambulating independently.  Kaitlyn Potter 53 y.o.  female perimenopausal stage I lobular cancer ER/PR positive HER-2 negative on  Tamoxifen and review the results of mammogram,   Patient denies any worsening hot flashes.  Patient admits to weight gain.  Otherwise denies any mood swings.   Review of Systems  Constitutional:  Negative for chills, diaphoresis, fever, malaise/fatigue and weight loss.  HENT:  Negative for nosebleeds and sore throat.   Eyes:  Negative for double vision.  Respiratory:  Negative for cough, hemoptysis, sputum production, shortness of breath and wheezing.   Cardiovascular:  Negative for chest pain, palpitations, orthopnea and leg swelling.  Gastrointestinal:  Negative for abdominal pain, blood in stool, constipation, diarrhea,  heartburn, melena, nausea and vomiting.  Genitourinary:  Negative for dysuria, frequency and urgency.  Musculoskeletal:  Positive for joint pain. Negative for back pain.  Skin: Negative.  Negative for itching and rash.  Neurological:  Negative for dizziness, tingling, focal weakness, weakness and headaches.  Endo/Heme/Allergies:  Does not bruise/bleed easily.  Psychiatric/Behavioral:  Negative for depression. The patient has insomnia. The patient is not nervous/anxious.      MEDICAL HISTORY:  Past Medical History:  Diagnosis Date   Acquired hypothyroidism    Arthritis    psoriatic   Asthma    Breast cancer (HCC)    Carcinoma of upper-outer quadrant of right breast in female, estrogen receptor positive (HCC)    Heart murmur    Hypertension    Hypertriglyceridemia    Personal history of radiation therapy 10/2019   rt breast   Psoriasis    Type 2 diabetes mellitus with microalbuminuria, without long-term current use of insulin (HCC)     SURGICAL HISTORY: Past Surgical History:  Procedure Laterality Date   BREAST BIOPSY Right 09/08/2016   affrim stereo for calcs attempted but area to close to skin   BREAST BIOPSY Right 08/2019   Inv lobular CA with high grade DCIS   BREAST BIOPSY Right 09/16/2023   Right Breast Stereo, X clip- path pending   BREAST BIOPSY Right 09/16/2023   MM RT BREAST BX W LOC DEV 1ST LESION IMAGE BX SPEC STEREO GUIDE 09/16/2023 [facility]   BREAST LUMPECTOMY Right 10/27/2019   Invasive Lobular CA with high grade DCIS + ant margin   BREAST LUMPECTOMY Right 11/06/2019   re-excision   BREAST LUMPECTOMY WITH NEEDLE LOCALIZATION Right 10/27/2019   Procedure: BREAST  LUMPECTOMY WITH NEEDLE LOCALIZATION;  Surgeon: Earline Mayotte, MD;  Location: [facility];  Service: General;  Laterality: Right;  and SLN   LAPAROSCOPIC CHOLECYSTECTOMY     lasix eye surgery     RE-EXCISION OF BREAST CANCER,SUPERIOR MARGINS Right 11/06/2019   Procedure: RE-EXCISION OF BREAST  CANCER,SUPERIOR MARGINS;  Surgeon: Earline Mayotte, MD;  Location: [facility];  Service: General;  Laterality: Right;   WISDOM TOOTH EXTRACTION      SOCIAL HISTORY: Social History   Socioeconomic History   Marital status: Single    Spouse name: Not on file   Number of children: Not on file   Years of education: Not on file   Highest education level: Not on file  Occupational History   Not on file  Tobacco Use   Smoking status: Some Days   Smokeless tobacco: Never   Tobacco comments:    rarely  Vaping Use   Vaping status: Never Used  Substance and Sexual Activity   Alcohol use: Yes    Alcohol/week: 1.0 - 2.0 standard drink of alcohol    Types: 1 - 2 Glasses of wine per week    Comment: rarely   Drug use: Never   Sexual activity: Not on file  Other Topics Concern   Not on file  Social History Narrative   Lives in [place] with parents; no children; never married; 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    Social Drivers of Corporate investment banker Strain: Low Risk  (09/07/2023)   Received from [facility]   Overall Financial Resource Strain (CARDIA)    Difficulty of Paying Living Expenses: Not very hard  Food Insecurity: Patient Declined (09/07/2023)   Received from [facility]   Hunger Vital Sign    Worried About Running Out of Food in the Last Year: Patient declined    Ran Out of Food in the Last Year: Patient declined  Transportation Needs: No Transportation Needs (09/07/2023)   Received from [facility]    In the past 12 months, has lack of transportation kept you from medical appointments or from getting medications?: No    Lack of Transportation (Non-Medical): No  Physical Activity: Not on file  Stress: Not on file  Social Connections: Not on file  Intimate Partner Violence: Not on file    FAMILY HISTORY: Family History  Problem Relation Age of Onset    Prostate cancer Paternal Grandfather    Asthma Father    Stroke Father    Hyperlipidemia Father    Diabetes Mother    Thyroid disease Mother    Breast cancer Neg Hx     ALLERGIES:  is allergic to sulfa antibiotics and clarithromycin.  MEDICATIONS:  Current Outpatient Medications  Medication Sig Dispense Refill   albuterol (VENTOLIN HFA) 108 (90 Base) MCG/ACT inhaler Inhale 2 puffs into the lungs every 6 (six) hours as needed for wheezing or shortness of breath.      aspirin EC 81 MG tablet Take 81 mg by mouth daily. Swallow whole.     cetirizine (ZYRTEC) 10 MG tablet Take 10 mg by mouth daily.     Cholecalciferol 25 MCG (1000 UT) capsule Take 1,000 Units by mouth daily.      cyanocobalamin (,VITAMIN B-12,) 1000 MCG/ML injection Inject 1,000 mcg into the muscle every 30 (thirty) days.     docusate sodium (COLACE) 100 MG capsule Take 100 mg by  mouth daily.     etanercept (ENBREL SURECLICK) 50 MG/ML injection Inject 50 mg into the skin every 14 (fourteen) days.      fluticasone (FLONASE) 50 MCG/ACT nasal spray Place 2 sprays into both nostrils daily as needed for allergies.      Glucose Blood (COOL BLOOD GLUCOSE TEST STRIPS VI) Use 3 (three) times daily Use as instructed. Freestyle Lite Test Strips E11.29     losartan-hydrochlorothiazide (HYZAAR) 50-12.5 MG tablet Take 1 tablet by mouth daily.     Multiple Vitamin (MULTIVITAMIN) capsule Take 1 capsule by mouth daily.     Semaglutide, 1 MG/DOSE, (OZEMPIC, 1 MG/DOSE,) 2 MG/1.5ML SOPN Inject 1 mg as directed once a week.     tamoxifen (NOLVADEX) 20 MG tablet Take 1 tablet (20 mg total) by mouth daily. 90 tablet 4   No current facility-administered medications for this visit.      Marland Kitchen  PHYSICAL EXAMINATION: ECOG PERFORMANCE STATUS: 0 - Asymptomatic  Vitals:   10/04/23 1339  BP: (!) 148/79  Pulse: 100  Temp: 98.5 F (36.9 C)  SpO2: 100%    Filed Weights   10/04/23 1339  Weight: 267 lb (121.1 kg)     Physical  Exam Constitutional:      Comments: Patient is alone.  HENT:     Head: Normocephalic and atraumatic.     Mouth/Throat:     Pharynx: No oropharyngeal exudate.  Eyes:     Pupils: Pupils are equal, round, and reactive to light.  Cardiovascular:     Rate and Rhythm: Normal rate and regular rhythm.  Pulmonary:     Effort: Pulmonary effort is normal. No respiratory distress.     Breath sounds: Normal breath sounds. No wheezing.  Abdominal:     General: Bowel sounds are normal. There is no distension.     Palpations: Abdomen is soft. There is no mass.     Tenderness: There is no abdominal tenderness. There is no guarding or rebound.  Musculoskeletal:        General: No tenderness. Normal range of motion.     Cervical back: Normal range of motion and neck supple.  Skin:    General: Skin is warm.  Neurological:     Mental Status: She is alert and oriented to person, place, and time.  Psychiatric:        Mood and Affect: Affect normal.     Vesicular lesions noted right-nape of the neck. LABORATORY DATA:  I have reviewed the data as listed Lab Results  Component Value Date   WBC 8.6 09/22/2019   HGB 14.5 09/22/2019   HCT 43.7 09/22/2019   MCV 93.4 09/22/2019   PLT 308 09/22/2019   No results for input(s): "NA", "K", "CL", "CO2", "GLUCOSE", "BUN", "CREATININE", "CALCIUM", "GFRNONAA", "GFRAA", "PROT", "ALBUMIN", "AST", "ALT", "ALKPHOS", "BILITOT", "BILIDIR", "IBILI" in the last 8760 hours.  RADIOGRAPHIC STUDIES: I have personally reviewed the radiological images as listed and agreed with the findings in the report. MM RT BREAST BX W LOC DEV 1ST LESION IMAGE BX SPEC STEREO GUIDE Addendum Date: 09/28/2023 ADDENDUM REPORT: 09/28/2023 07:45 ADDENDUM: PATHOLOGY revealed: 1. Breast, right, needle core biopsy, upper inner quadrant (x clip) : - BENIGN BREAST PARENCHYMA WITH STROMAL FIBROSIS AND ORGANIZING FAT NECROSIS CONSISTENT WITH PRIOR SURGICAL PROCEDURE - FOCAL MICROCALCIFICATION -  NEGATIVE FOR MALIGNANCY. Pathology results are CONCORDANT with imaging findings, per Dr. Jacob Moores. Pathology results and recommendations were discussed with patient via telephone on 09/21/2023 by Randa Lynn RN. Patient reported biopsy  site doing well with no adverse symptoms, and only slight tenderness at the site. Post biopsy care instructions were reviewed, questions were answered and my direct phone number was provided. Patient was instructed to call [facility] for any additional questions or concerns related to biopsy site. RECOMMENDATION: Patient instructed to resume annual bilateral screening mammogram due December 2025. Pathology results reported by Randa Lynn RN on 09/21/2023. Electronically Signed   By: Jacob Moores M.D.   On: 09/28/2023 07:45   Result Date: 09/28/2023 CLINICAL DATA:  53 year old female with indeterminate focal asymmetry in the upper inner right breast, presenting for stereotactic guided biopsy. History of right breast lumpectomy for invasive lobular carcinoma and associated DCIS in February 2021. EXAM: RIGHT BREAST STEREOTACTIC CORE NEEDLE BIOPSY COMPARISON:  Previous exam(s). FINDINGS: The patient and I discussed the procedure of stereotactic-guided biopsy including benefits and alternatives. We discussed the high likelihood of a successful procedure. We discussed the risks of the procedure including infection, bleeding, tissue injury, clip migration, and inadequate sampling. Informed written consent was given. The usual time out protocol was performed immediately prior to the procedure. Using sterile technique and 1% Lidocaine as local anesthetic, under stereotactic guidance, a 9 gauge vacuum assisted device was used to perform core needle biopsy of a focal asymmetry with associated coarse heterogeneous calcification in the upper inner right breast using a superior approach. Specimen radiograph was performed showing representative calcifications. Specimens with  calcifications are identified for pathology. Lesion quadrant: Upper inner quadrant At the conclusion of the procedure, an X shaped tissue marker clip was deployed into the biopsy cavity. Follow-up 2-view mammogram was performed and dictated separately. IMPRESSION: Stereotactic-guided biopsy of a focal asymmetry in the upper inner right breast. No apparent complications. Electronically Signed: By: Jacob Moores M.D. On: 09/16/2023 08:08   MM CLIP PLACEMENT RIGHT Result Date: 09/16/2023 CLINICAL DATA:  Status post stereotactic guided biopsy of an indeterminate focal asymmetry in the right breast EXAM: 3D DIAGNOSTIC RIGHT MAMMOGRAM POST STEREOTACTIC BIOPSY COMPARISON:  Previous exam(s). FINDINGS: 3D Mammographic images were obtained following stereotactic guided biopsy of a focal asymmetry with associated coarse heterogeneous calcifications in the upper inner right breast posterior depth. The X shaped biopsy marking clip is in expected position at the site of biopsy. A small post-procedure hematoma is noted at the site of biopsy, which was managed with manual pressure until hemostasis was achieved. IMPRESSION: Appropriate positioning of the X shaped biopsy marking clip at the site of biopsy in the upper inner right breast. Final Assessment: Post Procedure Mammograms for Marker Placement Electronically Signed   By: Jacob Moores M.D.   On: 09/16/2023 08:14   MM 3D DIAGNOSTIC MAMMOGRAM UNILATERAL RIGHT BREAST Result Date: 09/13/2023 CLINICAL DATA:  Screening recall for possible RIGHT breast asymmetry. She is status post RIGHT breast lumpectomy for invasive lobular carcinoma with associated DCIS in February 2021. EXAM: DIGITAL DIAGNOSTIC UNILATERAL RIGHT MAMMOGRAM WITH TOMOSYNTHESIS AND CAD; ULTRASOUND RIGHT BREAST LIMITED TECHNIQUE: Right digital diagnostic mammography and breast tomosynthesis was performed. The images were evaluated with computer-aided detection. ; Targeted ultrasound examination of the  right breast was performed COMPARISON:  Previous exam(s). ACR Breast Density Category b: There are scattered areas of fibroglandular density. FINDINGS: Mammogram: Spot tomosynthesis views demonstrate a 4 mm focal asymmetry with associated coarse heterogeneous calcifications in the upper slightly inner RIGHT breast posterior depth (spot CC image 32/72, spot MLO image 27/84, ML image 42/86). This corresponds with the screening mammogram finding and is new compared to prior exams. Stable  postlumpectomy changes are noted in the upper-outer right breast. Ultrasound: Targeted right breast ultrasound was performed by the sonographer and the physician. At 12:30 o'clock 9 cm from the nipple, there is a subtle focal hypoechoic area that measures up to 4 mm, favored to correspond with the focal asymmetry seen on mammogram. IMPRESSION: RIGHT breast 4 mm focal asymmetry in the upper slightly inner quadrant, with a possible sonographic correlate, is low suspicion for malignancy. Recommend further assessment with stereotactic guided biopsy, as the area is better appreciated on mammogram. RECOMMENDATION: Right breast stereotactic guided biopsy (1 site). I have discussed the findings and recommendations with the patient. The biopsy procedure was discussed with the patient and questions were answered. Patient expressed their understanding of the biopsy recommendation. Patient will be scheduled for biopsy at her earliest convenience by the schedulers. Ordering provider will be notified. If applicable, a reminder letter will be sent to the patient regarding the next appointment. BI-RADS CATEGORY  4: Suspicious. Electronically Signed   By: Jacob Moores M.D.   On: 09/13/2023 16:24   [place] LIMITED ULTRASOUND INCLUDING AXILLA RIGHT BREAST Result Date: 09/13/2023 CLINICAL DATA:  Screening recall for possible RIGHT breast asymmetry. She is status post RIGHT breast lumpectomy for invasive lobular carcinoma with associated DCIS in  February 2021. EXAM: DIGITAL DIAGNOSTIC UNILATERAL RIGHT MAMMOGRAM WITH TOMOSYNTHESIS AND CAD; ULTRASOUND RIGHT BREAST LIMITED TECHNIQUE: Right digital diagnostic mammography and breast tomosynthesis was performed. The images were evaluated with computer-aided detection. ; Targeted ultrasound examination of the right breast was performed COMPARISON:  Previous exam(s). ACR Breast Density Category b: There are scattered areas of fibroglandular density. FINDINGS: Mammogram: Spot tomosynthesis views demonstrate a 4 mm focal asymmetry with associated coarse heterogeneous calcifications in the upper slightly inner RIGHT breast posterior depth (spot CC image 32/72, spot MLO image 27/84, ML image 42/86). This corresponds with the screening mammogram finding and is new compared to prior exams. Stable postlumpectomy changes are noted in the upper-outer right breast. Ultrasound: Targeted right breast ultrasound was performed by the sonographer and the physician. At 12:30 o'clock 9 cm from the nipple, there is a subtle focal hypoechoic area that measures up to 4 mm, favored to correspond with the focal asymmetry seen on mammogram. IMPRESSION: RIGHT breast 4 mm focal asymmetry in the upper slightly inner quadrant, with a possible sonographic correlate, is low suspicion for malignancy. Recommend further assessment with stereotactic guided biopsy, as the area is better appreciated on mammogram. RECOMMENDATION: Right breast stereotactic guided biopsy (1 site). I have discussed the findings and recommendations with the patient. The biopsy procedure was discussed with the patient and questions were answered. Patient expressed their understanding of the biopsy recommendation. Patient will be scheduled for biopsy at her earliest convenience by the schedulers. Ordering provider will be notified. If applicable, a reminder letter will be sent to the patient regarding the next appointment. BI-RADS CATEGORY  4: Suspicious. Electronically  Signed   By: Jacob Moores M.D.   On: 09/13/2023 16:24    ASSESSMENT & PLAN:   Carcinoma of upper-outer quadrant of right breast in female, estrogen receptor positive (HCC) #Right breast cancer lobular-stage I grade 2-ER/PR positive HER-2 negative; Currently on Tamoxifen [Dr.schermerhorn]. Tolerating well. ;stable.  Dr.Cintron/- DEC 2024-abnormal mammogram followed by diagnostic mammogram/ultrasound-biopsy-negative for malignancy [ BENIGN BREAST PARENCHYMA WITH STROMAL FIBROSIS AND ORGANIZING FAT NECROSIS CONSISTENT WITH PRIOR SURGICAL PROCEDURE - FOCAL MICROCALCIFICATION - NEGATIVE FOR MALIGNANCY].   # tamoxifen Until April 2026.   # Hot flashes: G-1-stable.  Monitor for now.  Stable.   # weight gain - 30 pounds in 6 months/ weight loss program [[place]- weight loss program-started]; on Ozempic  # DISPOSITION: # follow up in 12 months-MD- no labs- Dr.B  All questions were answered. The patient/family knows to call the clinic with any problems, questions or concerns.     Earna Coder, MD 10/04/2023 2:07 PM

## 2024-02-08 ENCOUNTER — Other Ambulatory Visit: Payer: Self-pay | Admitting: Nurse Practitioner

## 2024-02-08 ENCOUNTER — Telehealth: Payer: Self-pay | Admitting: *Deleted

## 2024-02-08 DIAGNOSIS — N939 Abnormal uterine and vaginal bleeding, unspecified: Secondary | ICD-10-CM

## 2024-02-08 DIAGNOSIS — Z9229 Personal history of other drug therapy: Secondary | ICD-10-CM

## 2024-02-08 NOTE — Progress Notes (Signed)
 Patient with history of breast cancer, on tamoxifen , reports vaginal bleeding since Sunday. She has not been menstruating since being on tamoxifen . She is established with [facility]. Recommend she contact them for evaluation and management. She is ok to hold tamoxifen  for now. She will reach back out to us  with appt details/results once she has seen gyn.

## 2024-02-08 NOTE — Telephone Encounter (Signed)
 Patient called in saying that she is having bleeding from the vaginal area for 3 days now.  He is on tamoxifen .  I spoke to the covering Dr. Ronalee Cocking again and he says to hold the tamoxifen  and the patient will need an exam GYN and I going to speak with Kenney Peacemaker to see if we can get her in here today or tomorrow.  I did tell the patient that I will call her back once I know and she is okay with either of the days.  She did say that she has had some spotting before but it has been a little while and it was very light and on only 1 day of it.

## 2024-02-16 ENCOUNTER — Telehealth: Payer: Self-pay | Admitting: *Deleted

## 2024-02-16 NOTE — Telephone Encounter (Signed)
 The patient called back about should she go back on her tamoxifen .  Her and wanted to check with Dr. Valentine Gasmen and this afternoon he did tell her that you can go back on the tamoxifen  and they still will be waiting with the biopsy results and go from there.  Patient is okay with that and she feels like she would be safer on tamoxifen  than nothing.

## 2024-02-16 NOTE — Telephone Encounter (Signed)
 Patient called today and said that she talk to Lauren last week when she was bleeding and she told her to stop the tamoxifen .  She was also told to see her GYN.  She did go and see Dr. Alvia Awkward but I do not see results. The patient wants to know about results and if she needs anything

## 2024-08-02 ENCOUNTER — Other Ambulatory Visit: Payer: Self-pay | Admitting: General Surgery

## 2024-08-02 DIAGNOSIS — Z1231 Encounter for screening mammogram for malignant neoplasm of breast: Secondary | ICD-10-CM

## 2024-09-04 ENCOUNTER — Ambulatory Visit
Admission: RE | Admit: 2024-09-04 | Discharge: 2024-09-04 | Disposition: A | Discharge status: Home / Self Care | Source: Ambulatory Visit | Attending: General Surgery | Admitting: General Surgery

## 2024-09-04 DIAGNOSIS — Z1231 Encounter for screening mammogram for malignant neoplasm of breast: Secondary | ICD-10-CM | POA: Diagnosis present

## 2024-10-03 ENCOUNTER — Inpatient Hospital Stay: Payer: Managed Care, Other (non HMO) | Attending: Internal Medicine | Admitting: Internal Medicine

## 2024-10-03 ENCOUNTER — Encounter: Payer: Self-pay | Admitting: Internal Medicine

## 2024-10-03 VITALS — BP 122/89 | HR 94 | Temp 98.2°F | Resp 18 | Ht 67.0 in | Wt 251.8 lb

## 2024-10-03 DIAGNOSIS — Z1732 Human epidermal growth factor receptor 2 negative status: Secondary | ICD-10-CM | POA: Insufficient documentation

## 2024-10-03 DIAGNOSIS — Z923 Personal history of irradiation: Secondary | ICD-10-CM | POA: Diagnosis not present

## 2024-10-03 DIAGNOSIS — C50411 Malignant neoplasm of upper-outer quadrant of right female breast: Secondary | ICD-10-CM | POA: Diagnosis not present

## 2024-10-03 DIAGNOSIS — Z79810 Long term (current) use of selective estrogen receptor modulators (SERMs): Secondary | ICD-10-CM | POA: Insufficient documentation

## 2024-10-03 DIAGNOSIS — Z1721 Progesterone receptor positive status: Secondary | ICD-10-CM | POA: Insufficient documentation

## 2024-10-03 DIAGNOSIS — Z170 Estrogen receptor positive status [ER+]: Secondary | ICD-10-CM | POA: Diagnosis not present

## 2024-10-03 NOTE — Assessment & Plan Note (Addendum)
#  Right breast cancer lobular-stage I grade 2-ER/PR positive HER-2 negative; Currently on Tamoxifen  [Dr.schermerhorn]. Tolerating well. ;stable.  Dr.Cintron/- DEC 2025- WNL.   # tamoxifen  Until April 2026.   # Vaginal bleeding- D&C- [Dr.S; Gyn]TVS- NEG;   # Hot flashes: G-1-stable.  Monitor for now. Stable.   # weight gain - 30 pounds in 6 months/ weight loss program [[place]- weight loss program-started]; on Ozempic- improved.   #Since patient is clinically stable I think is reasonable for the patient to follow-up with PCP- Ms.Glenda Fields/can follow-up with us  as needed.  Patient comfortable with the plan; to call us  if any questions or concerns in the interim.  # DISPOSITION: # follow up as needed- - Dr.B

## 2024-10-03 NOTE — Progress Notes (Signed)
 Mammogram 09/12/2024.

## 2024-10-03 NOTE — Progress Notes (Signed)
 [facility] CONSULT NOTE  Patient Care Team: Kaitlyn Ophelia JINNY DOUGLAS, MD as PCP - General (Internal Medicine) Kaitlyn Jesusa HERO, RN as Registered Nurse Kaitlyn, Reyes ORN, MD as Consulting Physician (General Surgery) Kaitlyn Aran, MD as Referring Physician (Radiation Oncology) Kaitlyn Cindy SAUNDERS, MD as Consulting Physician (Internal Medicine)  CHIEF COMPLAINTS/PURPOSE OF CONSULTATION: Breast cancer  #  Oncology History Overview Note  # JAN 2021- INVASIVE LOBULAR CARCINOMA, TUBULOLOBULAR PATTERN s/p Lumpectomy; re-excision [Dr.Byrnett]; G-2 ER/PR> 90%; HER-2 negative;  pT1c pN0 (sn); Oncotype RS- 17; NO chemo. S/p RT -mammosite  # April 20/2021- START TAMOXIFEN - until summer Tamoxfen.   # DM; Psoriatic arthritis [Enbrel;Dr.Kernodle];   # SURVIVORSHIP: pending  DIAGNOSIS: Breast cancer  STAGE:  I       ;  GOALS: cure  CURRENT/MOST RECENT THERAPY : Tamoxifen         Carcinoma of upper-outer quadrant of right breast in female, estrogen receptor positive (HCC)  09/22/2019 Initial Diagnosis   Carcinoma of upper-outer quadrant of right breast in female, estrogen receptor positive (HCC)     HISTORY OF PRESENTING ILLNESS: Alone.  Ambulating independently.  Kaitlyn Potter 54 y.o.  female perimenopausal stage I lobular cancer ER/PR positive HER-2 negative on  Tamoxifen  and review the results of mammogram.  Discussed the use of AI scribe software for clinical note transcription with the patient, who gave verbal consent to proceed.  History of Present Illness   Kaitlyn Potter is a 54 year old female with estrogen receptor positive right breast carcinoma who presents for five-year oncology follow-up.  She was diagnosed with right breast carcinoma in 2021 and underwent lumpectomy followed by adjuvant radiation therapy. She initiated tamoxifen  in April 2021 and is now approaching five years of endocrine therapy. She has tolerated tamoxifen  without significant adverse  effects and has not experienced hot flashes recently. Her most recent mammogram was normal. She expressed anxiety regarding mammogram follow-up due to a prior abnormal result in 2024 that required biopsy, which was negative for malignancy, but is reassured by the current findings.  She experienced a single episode of vaginal bleeding this past summer, which was evaluated with vaginal ultrasound and dilation and curettage with endometrial sampling; all results were unremarkable. She resumed tamoxifen  after the workup and has not had further episodes of abnormal bleeding. She has not had a menstrual period since her lumpectomy, except for the isolated episode this past summer, and expressed concern about the possibility of menses returning after discontinuing tamoxifen .  She has been actively engaged in weight loss efforts, with a current weight of 251 pounds, down from 267 pounds at her last visit and previously over 300 pounds. She remains motivated and has not reported any new symptoms or concerns related to her cancer or its treatment.     Review of Systems  Constitutional:  Negative for chills, diaphoresis, fever, malaise/fatigue and weight loss.  HENT:  Negative for nosebleeds and sore throat.   Eyes:  Negative for double vision.  Respiratory:  Negative for cough, hemoptysis, sputum production, shortness of breath and wheezing.   Cardiovascular:  Negative for chest pain, palpitations, orthopnea and leg swelling.  Gastrointestinal:  Negative for abdominal pain, blood in stool, constipation, diarrhea, heartburn, melena, nausea and vomiting.  Genitourinary:  Negative for dysuria, frequency and urgency.  Musculoskeletal:  Positive for joint pain. Negative for back pain.  Skin: Negative.  Negative for itching and rash.  Neurological:  Negative for dizziness, tingling, focal weakness, weakness and headaches.  Endo/Heme/Allergies:  Does  not bruise/bleed easily.  Psychiatric/Behavioral:  Negative for  depression. The patient has insomnia. The patient is not nervous/anxious.      MEDICAL HISTORY:  Past Medical History:  Diagnosis Date   Acquired hypothyroidism    Arthritis    psoriatic   Asthma    Breast cancer (HCC)    Carcinoma of upper-outer quadrant of right breast in female, estrogen receptor positive (HCC)    Heart murmur    Hypertension    Hypertriglyceridemia    Personal history of radiation therapy 10/2019   rt breast   Psoriasis    Type 2 diabetes mellitus with microalbuminuria, without long-term current use of insulin (HCC)     SURGICAL HISTORY: Past Surgical History:  Procedure Laterality Date   BREAST BIOPSY Right 09/08/2016   affrim stereo for calcs attempted but area to close to skin   BREAST BIOPSY Right 08/2019   Inv lobular CA with high grade DCIS   BREAST BIOPSY Right 09/16/2023   Right Breast Stereo, X clip- path pending   BREAST BIOPSY Right 09/16/2023   MM RT BREAST BX W LOC DEV 1ST LESION IMAGE BX SPEC STEREO GUIDE 09/16/2023 [facility]   BREAST LUMPECTOMY Right 10/27/2019   Invasive Lobular CA with high grade DCIS + ant margin   BREAST LUMPECTOMY Right 11/06/2019   re-excision   BREAST LUMPECTOMY WITH NEEDLE LOCALIZATION Right 10/27/2019   Procedure: BREAST LUMPECTOMY WITH NEEDLE LOCALIZATION;  Surgeon: Kaitlyn Reyes ORN, MD;  Location: [facility];  Service: General;  Laterality: Right;  and SLN   LAPAROSCOPIC CHOLECYSTECTOMY     lasix eye surgery     RE-EXCISION OF BREAST CANCER,SUPERIOR MARGINS Right 11/06/2019   Procedure: RE-EXCISION OF BREAST CANCER,SUPERIOR MARGINS;  Surgeon: Kaitlyn Reyes ORN, MD;  Location: [facility];  Service: General;  Laterality: Right;   WISDOM TOOTH EXTRACTION      SOCIAL HISTORY: Social History   Socioeconomic History   Marital status: Single    Spouse name: Not on file   Number of children: Not on file   Years of education: Not on file   Highest education level: Not on file  Occupational History   Not on  file  Tobacco Use   Smoking status: Some Days   Smokeless tobacco: Never   Tobacco comments:    rarely  Vaping Use   Vaping status: Never Used  Substance and Sexual Activity   Alcohol use: Yes    Alcohol/week: 1.0 - 2.0 standard drink of alcohol    Types: 1 - 2 Glasses of wine per week    Comment: rarely   Drug use: Never   Sexual activity: Not on file  Other Topics Concern   Not on file  Social History Narrative   Lives in [place] with parents; no children; never married; 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    Social Drivers of Health   Tobacco Use: High Risk (10/03/2024)   Patient History    Smoking Tobacco Use: Some Days    Smokeless Tobacco Use: Never    Passive Exposure: Not on file  Financial Resource Strain: Low Risk  (08/16/2024)   Received from [facility]   Overall Financial Resource Strain (CARDIA)    Difficulty of Paying Living Expenses: Not very hard  Food Insecurity: No Food Insecurity (08/16/2024)   Received from [facility]   Epic    Within the past 12 months, you worried that your food would run out before you  got the money to buy more.: Never true    Within the past 12 months, the food you bought just didn't last and you didn't have money to get more.: Never true  Transportation Needs: No Transportation Needs (08/16/2024)   Received from [facility] - Transportation    In the past 12 months, has lack of transportation kept you from medical appointments or from getting medications?: No    Lack of Transportation (Non-Medical): No  Physical Activity: Not on file  Stress: Not on file  Social Connections: Not on file  Intimate Partner Violence: Not on file  Depression (PHQ2-9): Low Risk (10/03/2024)   Depression (PHQ2-9)    PHQ-2 Score: 0  Alcohol Screen: Not on file  Housing: Low Risk  (08/16/2024)   Received from [facility]   Epic    In the last 12  months, was there a time when you were not able to pay the mortgage or rent on time?: No    In the past 12 months, how many times have you moved where you were living?: 0    At any time in the past 12 months, were you homeless or living in a shelter (including now)?: No  Utilities: Not At Risk (08/16/2024)   Received from [facility]   Epic    In the past 12 months has the electric, gas, oil, or water company threatened to shut off services in your home?: No  Health Literacy: Not on file    FAMILY HISTORY: Family History  Problem Relation Age of Onset   Prostate cancer Paternal Grandfather    Asthma Father    Stroke Father    Hyperlipidemia Father    Diabetes Mother    Thyroid disease Mother    Breast cancer Neg Hx     ALLERGIES:  is allergic to sulfa antibiotics and clarithromycin.  MEDICATIONS:  Current Outpatient Medications  Medication Sig Dispense Refill   albuterol  (VENTOLIN  HFA) 108 (90 Base) MCG/ACT inhaler Inhale 2 puffs into the lungs every 6 (six) hours as needed for wheezing or shortness of breath.      aspirin EC 81 MG tablet Take 81 mg by mouth daily. Swallow whole.     cetirizine (ZYRTEC) 10 MG tablet Take 10 mg by mouth daily.     Cholecalciferol 25 MCG (1000 UT) capsule Take 1,000 Units by mouth daily.      cyanocobalamin (VITAMIN B12) 1000 MCG tablet Take 1,000 mcg by mouth daily.     docusate sodium (COLACE) 100 MG capsule Take 100 mg by mouth daily.     etanercept (ENBREL SURECLICK) 50 MG/ML injection Inject 50 mg into the skin every 14 (fourteen) days.      fluticasone (FLONASE) 50 MCG/ACT nasal spray Place 2 sprays into both nostrils daily as needed for allergies.      Glucose Blood (COOL BLOOD GLUCOSE TEST STRIPS VI) Use 3 (three) times daily Use as instructed. Freestyle Lite Test Strips E11.29     losartan-hydrochlorothiazide (HYZAAR) 50-12.5 MG tablet Take 1 tablet by mouth daily.     Multiple Vitamin (MULTIVITAMIN) capsule Take 1 capsule  by mouth daily.     Semaglutide, 1 MG/DOSE, (OZEMPIC, 1 MG/DOSE,) 2 MG/1.5ML SOPN Inject 1 mg as directed once a week.     tamoxifen  (NOLVADEX ) 20 MG tablet Take 1 tablet (20 mg total) by mouth daily. 90 tablet 4   No current facility-administered medications for this visit.      SABRA  PHYSICAL EXAMINATION: ECOG PERFORMANCE STATUS: 0 - Asymptomatic  Vitals:   10/03/24 1256  BP: 122/89  Pulse: 94  Resp: 18  Temp: 98.2 F (36.8 C)  SpO2: 100%    Filed Weights   10/03/24 1256  Weight: 251 lb 12.8 oz (114.2 kg)     Physical Exam Constitutional:      Comments: Patient is alone.  HENT:     Head: Normocephalic and atraumatic.     Mouth/Throat:     Pharynx: No oropharyngeal exudate.  Eyes:     Pupils: Pupils are equal, round, and reactive to light.  Cardiovascular:     Rate and Rhythm: Normal rate and regular rhythm.  Pulmonary:     Effort: Pulmonary effort is normal. No respiratory distress.     Breath sounds: Normal breath sounds. No wheezing.  Abdominal:     General: Bowel sounds are normal. There is no distension.     Palpations: Abdomen is soft. There is no mass.     Tenderness: There is no abdominal tenderness. There is no guarding or rebound.  Musculoskeletal:        General: No tenderness. Normal range of motion.     Cervical back: Normal range of motion and neck supple.  Skin:    General: Skin is warm.  Neurological:     Mental Status: She is alert and oriented to person, place, and time.  Psychiatric:        Mood and Affect: Affect normal.     Vesicular lesions noted right-nape of the neck. LABORATORY DATA:  I have reviewed the data as listed Lab Results  Component Value Date   WBC 8.6 09/22/2019   HGB 14.5 09/22/2019   HCT 43.7 09/22/2019   MCV 93.4 09/22/2019   PLT 308 09/22/2019   No results for input(s): NA, K, CL, CO2, GLUCOSE, BUN, CREATININE, CALCIUM, GFRNONAA, GFRAA, PROT, ALBUMIN, AST, ALT, ALKPHOS, BILITOT,  BILIDIR, IBILI in the last 8760 hours.  RADIOGRAPHIC STUDIES: I have personally reviewed the radiological images as listed and agreed with the findings in the report. MM 3D SCREENING MAMMOGRAM BILATERAL BREAST Result Date: 09/12/2024 CLINICAL DATA:  Screening. EXAM: DIGITAL SCREENING BILATERAL MAMMOGRAM WITH TOMOSYNTHESIS AND CAD TECHNIQUE: Bilateral screening digital craniocaudal and mediolateral oblique mammograms were obtained. Bilateral screening digital breast tomosynthesis was performed. The images were evaluated with computer-aided detection. COMPARISON:  Previous exam(s). ACR Breast Density Category b: There are scattered areas of fibroglandular density. FINDINGS: There are no findings suspicious for malignancy. IMPRESSION: No mammographic evidence of malignancy. A result letter of this screening mammogram will be mailed directly to the patient. RECOMMENDATION: Screening mammogram in one year. (Code:SM-B-01Y) BI-RADS CATEGORY  1: Negative. Electronically Signed   By: Dina  Arceo M.D.   On: 09/12/2024 08:05    ASSESSMENT & PLAN:   Carcinoma of upper-outer quadrant of right breast in female, estrogen receptor positive (HCC) #Right breast cancer lobular-stage I grade 2-ER/PR positive HER-2 negative; Currently on Tamoxifen  [Dr.schermerhorn]. Tolerating well. ;stable.  Dr.Cintron/- DEC 2025- WNL.   # tamoxifen  Until April 2026.   # Vaginal bleeding- D&C- [Dr.S; Gyn]TVS- NEG;   # Hot flashes: G-1-stable.  Monitor for now. Stable.   # weight gain - 30 pounds in 6 months/ weight loss program [[place]- weight loss program-started]; on Ozempic- improved.   #Since patient is clinically stable I think is reasonable for the patient to follow-up with PCP- Ms.Glenda Fields/can follow-up with us  as needed.  Patient comfortable with the plan; to call us  if  any questions or concerns in the interim.   # DISPOSITION: # follow up as needed- - Dr.B  All questions were answered. The patient/family knows  to call the clinic with any problems, questions or concerns.     Cindy JONELLE Joe, MD 10/03/2024 1:26 PM
